# Patient Record
Sex: Male | Born: 1955 | Race: White | Hispanic: No | Smoking: Current every day smoker
Health system: Southern US, Community
[De-identification: ages and names within clinical notes are randomized; demographics above are authoritative.]

## PROBLEM LIST (undated history)

## (undated) DIAGNOSIS — N289 Disorder of kidney and ureter, unspecified: Secondary | ICD-10-CM

## (undated) DIAGNOSIS — Q046 Congenital cerebral cysts: Secondary | ICD-10-CM

## (undated) DIAGNOSIS — B1920 Unspecified viral hepatitis C without hepatic coma: Secondary | ICD-10-CM

## (undated) DIAGNOSIS — I2510 Atherosclerotic heart disease of native coronary artery without angina pectoris: Secondary | ICD-10-CM

## (undated) DIAGNOSIS — J449 Chronic obstructive pulmonary disease, unspecified: Secondary | ICD-10-CM

## (undated) DIAGNOSIS — I10 Essential (primary) hypertension: Secondary | ICD-10-CM

## (undated) HISTORY — PX: ELBOW SURGERY: SHX618

## (undated) HISTORY — PX: CORONARY STENT PLACEMENT: SHX1402

## (undated) HISTORY — PX: TRANSURETHRAL RESECTION OF PROSTATE: SHX73

---

## 2010-10-31 DIAGNOSIS — I2510 Atherosclerotic heart disease of native coronary artery without angina pectoris: Secondary | ICD-10-CM | POA: Insufficient documentation

## 2010-10-31 DIAGNOSIS — Z720 Tobacco use: Secondary | ICD-10-CM | POA: Insufficient documentation

## 2010-10-31 DIAGNOSIS — R569 Unspecified convulsions: Secondary | ICD-10-CM | POA: Insufficient documentation

## 2010-10-31 DIAGNOSIS — I10 Essential (primary) hypertension: Secondary | ICD-10-CM | POA: Diagnosis present

## 2010-11-08 DIAGNOSIS — B1920 Unspecified viral hepatitis C without hepatic coma: Secondary | ICD-10-CM | POA: Insufficient documentation

## 2014-09-05 ENCOUNTER — Emergency Department (HOSPITAL_COMMUNITY)
Admission: EM | Admit: 2014-09-05 | Discharge: 2014-09-05 | Disposition: A | Discharge status: Home / Self Care | Payer: Self-pay | Attending: Emergency Medicine | Admitting: Emergency Medicine

## 2014-09-05 ENCOUNTER — Emergency Department (HOSPITAL_COMMUNITY): Payer: Self-pay

## 2014-09-05 ENCOUNTER — Encounter (HOSPITAL_COMMUNITY): Payer: Self-pay | Admitting: *Deleted

## 2014-09-05 DIAGNOSIS — Z880 Allergy status to penicillin: Secondary | ICD-10-CM | POA: Insufficient documentation

## 2014-09-05 DIAGNOSIS — S4992XA Unspecified injury of left shoulder and upper arm, initial encounter: Secondary | ICD-10-CM | POA: Insufficient documentation

## 2014-09-05 DIAGNOSIS — Y9389 Activity, other specified: Secondary | ICD-10-CM | POA: Insufficient documentation

## 2014-09-05 DIAGNOSIS — W1839XA Other fall on same level, initial encounter: Secondary | ICD-10-CM | POA: Insufficient documentation

## 2014-09-05 DIAGNOSIS — Z87448 Personal history of other diseases of urinary system: Secondary | ICD-10-CM | POA: Insufficient documentation

## 2014-09-05 DIAGNOSIS — Y998 Other external cause status: Secondary | ICD-10-CM | POA: Insufficient documentation

## 2014-09-05 DIAGNOSIS — J441 Chronic obstructive pulmonary disease with (acute) exacerbation: Secondary | ICD-10-CM | POA: Insufficient documentation

## 2014-09-05 DIAGNOSIS — Z720 Tobacco use: Secondary | ICD-10-CM | POA: Insufficient documentation

## 2014-09-05 DIAGNOSIS — Z8619 Personal history of other infectious and parasitic diseases: Secondary | ICD-10-CM | POA: Insufficient documentation

## 2014-09-05 DIAGNOSIS — Y9289 Other specified places as the place of occurrence of the external cause: Secondary | ICD-10-CM | POA: Insufficient documentation

## 2014-09-05 HISTORY — DX: Chronic obstructive pulmonary disease, unspecified: J44.9

## 2014-09-05 HISTORY — DX: Unspecified viral hepatitis C without hepatic coma: B19.20

## 2014-09-05 HISTORY — DX: Disorder of kidney and ureter, unspecified: N28.9

## 2014-09-05 MED ORDER — OXYCODONE-ACETAMINOPHEN 5-325 MG PO TABS
1.0000 | ORAL_TABLET | Freq: Once | ORAL | Status: AC
Start: 2014-09-05 — End: 2014-09-05
  Administered 2014-09-05: 1 via ORAL
  Filled 2014-09-05: qty 1

## 2014-09-05 MED ORDER — NAPROXEN 500 MG PO TABS: 500.0000 mg | ORAL_TABLET | Freq: Two times a day (BID) | ORAL | Status: DC

## 2014-09-05 MED ORDER — HYDROCODONE-ACETAMINOPHEN 7.5-325 MG PO TABS: 1.0000 | ORAL_TABLET | Freq: Four times a day (QID) | ORAL | Status: DC | PRN

## 2014-09-05 NOTE — ED Notes (Signed)
Patient reports falling last week and now reports left shoulder pain. Worse last couple of days.

## 2014-09-05 NOTE — ED Provider Notes (Signed)
CSN: [id number]     Arrival date & time 09/05/14  1101 History  This chart was scribed for non-physician practitioner, Pauline Aus, PA-C, working with Mancel Bale, MD by Marica Otter, ED Scribe. This patient was seen in room APFT20/APFT20 and the patient's care was started at 12:39 PM.   Chief Complaint  Patient presents with  . Shoulder Pain   The history is provided by the patient. No language interpreter was used.   PCP: No primary care provider on file. HPI Comments: Casey Walker is a 59 y.o. male, who presents to the Emergency Department complaining of a fall sustained last week and associated worsening, 8/10, intermittent, left sided, shoulder pain radiating to the left upper arm onset 3-4 days ago. Pt notes the pain is worse at night. Pt denies head trauma, neck pain or LOC resulting from the fall. Pt reports taking Advil at home without improvement. Pt denies, rib pain, back pain, chest wall pain, increased SOB (pt notes he has intermittent SOB at baseline due to COPD), chest pain, neck pain, or any other Sx at this time.   Pt notes he is followed closely by a cardiologist for his chronic heart conditions. Pt notes he had an EKG completed last month which showed no worsening Sx.   Past Medical History  Diagnosis Date  . COPD (chronic obstructive pulmonary disease)   . Hepatitis C   . Kidney disorder     due to hepatitis per patient   Past Surgical History  Procedure Laterality Date  . Elbow surgery    . Coronary stent placement     History reviewed. No pertinent family history. Social History  Substance Use Topics  . Smoking status: Current Every Day Smoker -- 0.25 packs/day  . Smokeless tobacco: None  . Alcohol Use: No    Review of Systems  Constitutional: Negative for fever and chills.  Musculoskeletal: Positive for arthralgias (left sided shoulder pain).  Psychiatric/Behavioral: Negative for confusion.  All other systems reviewed and are negative.  Allergies   Penicillins  Home Medications   Prior to Admission medications   Not on File   Triage Vitals: BP 138/84 mmHg  Pulse 58  Temp(Src) 98.5 F (36.9 C) (Oral)  Resp 18  Ht 5\' 8"  (1.727 m)  Wt 170 lb (77.111 kg)  BMI 25.85 kg/m2  SpO2 100% Physical Exam  Constitutional: He is oriented to person, place, and time. He appears well-developed and well-nourished.  HENT:  Head: Normocephalic.  Eyes: EOM are normal.  Neck: Normal range of motion.  Cardiovascular: Normal rate, regular rhythm and normal heart sounds.   Pulmonary/Chest: Effort normal and breath sounds normal.  Abdominal: He exhibits no distension.  Musculoskeletal: Normal range of motion.  Diffused tenderness of the left shoulder including the AC joint. Pain reproduced with abduction. Grip strength, distal sensation, pulses intact.   Neurological: He is alert and oriented to person, place, and time.  Psychiatric: He has a normal mood and affect.  Nursing note and vitals reviewed.  ED Course  Procedures (including critical care time) DIAGNOSTIC STUDIES: Oxygen Saturation is 100% on RA, nl by my interpretation.    COORDINATION OF CARE: 12:44 PM: Discussed treatment plan which includes discussing imaging results, icing the affected area, ortho referral with pt at bedside; patient verbalizes understanding and agrees with treatment plan.  Imaging Review Dg Shoulder Left  09/05/2014   CLINICAL DATA:  Fall 3 days ago.  Left shoulder pain and swelling.  EXAM: LEFT SHOULDER - 2+ VIEW  COMPARISON:  None.  FINDINGS: No acute bony abnormality. Specifically, no fracture, subluxation, or dislocation. Soft tissues are intact. Mild degenerative changes in the left AC joint.  IMPRESSION: No acute bony abnormality.   Electronically Signed   By: Charlett Nose M.D.   On: 09/05/2014 11:49     MDM   Final diagnoses:  Shoulder injury, left, initial encounter    XR neg for fx, pain likely related to deg changes.  No concerning sx's for  septic joint or cardiac process  Pt agrees to symptomatic tx and close PMD or orthopedic f/u.  Pt is well appearing and stable for d/c   I personally performed the services described in this documentation, which was scribed in my presence. The recorded information has been reviewed and is accurate.    Pauline Aus, PA-C 09/06/14 2305  Mancel Bale, MD 09/07/14 1420

## 2015-01-22 ENCOUNTER — Encounter (HOSPITAL_COMMUNITY): Payer: Self-pay | Admitting: Emergency Medicine

## 2015-01-22 ENCOUNTER — Emergency Department (HOSPITAL_COMMUNITY): Payer: Medicaid - Out of State

## 2015-01-22 ENCOUNTER — Emergency Department (HOSPITAL_COMMUNITY)
Admission: EM | Admit: 2015-01-22 | Discharge: 2015-01-22 | Disposition: A | Discharge status: Home / Self Care | Payer: Medicaid - Out of State | Attending: Emergency Medicine | Admitting: Emergency Medicine

## 2015-01-22 DIAGNOSIS — R0602 Shortness of breath: Secondary | ICD-10-CM | POA: Diagnosis not present

## 2015-01-22 DIAGNOSIS — J441 Chronic obstructive pulmonary disease with (acute) exacerbation: Secondary | ICD-10-CM | POA: Diagnosis not present

## 2015-01-22 DIAGNOSIS — S43402A Unspecified sprain of left shoulder joint, initial encounter: Secondary | ICD-10-CM | POA: Diagnosis not present

## 2015-01-22 DIAGNOSIS — S4992XA Unspecified injury of left shoulder and upper arm, initial encounter: Secondary | ICD-10-CM | POA: Diagnosis present

## 2015-01-22 DIAGNOSIS — Z791 Long term (current) use of non-steroidal anti-inflammatories (NSAID): Secondary | ICD-10-CM | POA: Diagnosis not present

## 2015-01-22 DIAGNOSIS — Z9861 Coronary angioplasty status: Secondary | ICD-10-CM | POA: Insufficient documentation

## 2015-01-22 DIAGNOSIS — W108XXA Fall (on) (from) other stairs and steps, initial encounter: Secondary | ICD-10-CM | POA: Diagnosis not present

## 2015-01-22 DIAGNOSIS — R05 Cough: Secondary | ICD-10-CM | POA: Insufficient documentation

## 2015-01-22 DIAGNOSIS — Z87448 Personal history of other diseases of urinary system: Secondary | ICD-10-CM | POA: Diagnosis not present

## 2015-01-22 DIAGNOSIS — S2231XA Fracture of one rib, right side, initial encounter for closed fracture: Secondary | ICD-10-CM | POA: Diagnosis not present

## 2015-01-22 DIAGNOSIS — Z8619 Personal history of other infectious and parasitic diseases: Secondary | ICD-10-CM | POA: Insufficient documentation

## 2015-01-22 DIAGNOSIS — Y998 Other external cause status: Secondary | ICD-10-CM | POA: Insufficient documentation

## 2015-01-22 DIAGNOSIS — Y9389 Activity, other specified: Secondary | ICD-10-CM | POA: Diagnosis not present

## 2015-01-22 DIAGNOSIS — Z880 Allergy status to penicillin: Secondary | ICD-10-CM | POA: Diagnosis not present

## 2015-01-22 DIAGNOSIS — S299XXA Unspecified injury of thorax, initial encounter: Secondary | ICD-10-CM | POA: Diagnosis not present

## 2015-01-22 DIAGNOSIS — Y9289 Other specified places as the place of occurrence of the external cause: Secondary | ICD-10-CM | POA: Insufficient documentation

## 2015-01-22 DIAGNOSIS — F17200 Nicotine dependence, unspecified, uncomplicated: Secondary | ICD-10-CM | POA: Insufficient documentation

## 2015-01-22 LAB — URINALYSIS, ROUTINE W REFLEX MICROSCOPIC
Bilirubin Urine: NEGATIVE
Glucose, UA: NEGATIVE mg/dL
Hgb urine dipstick: NEGATIVE
KETONES UR: NEGATIVE mg/dL
LEUKOCYTES UA: NEGATIVE
Nitrite: NEGATIVE
PH: 5.5 (ref 5.0–8.0)
Protein, ur: NEGATIVE mg/dL
Specific Gravity, Urine: 1.02 (ref 1.005–1.030)

## 2015-01-22 MED ORDER — DIAZEPAM 5 MG PO TABS
10.0000 mg | ORAL_TABLET | Freq: Once | ORAL | Status: AC
  Administered 2015-01-22: 10 mg via ORAL
  Filled 2015-01-22: qty 2

## 2015-01-22 MED ORDER — CYCLOBENZAPRINE HCL 10 MG PO TABS: 10.0000 mg | ORAL_TABLET | Freq: Three times a day (TID) | ORAL | Status: DC

## 2015-01-22 MED ORDER — KETOROLAC TROMETHAMINE 10 MG PO TABS
10.0000 mg | ORAL_TABLET | Freq: Once | ORAL | Status: AC
  Administered 2015-01-22: 10 mg via ORAL
  Filled 2015-01-22: qty 1

## 2015-01-22 MED ORDER — HYDROCODONE-ACETAMINOPHEN 5-325 MG PO TABS: 1.0000 | ORAL_TABLET | ORAL | Status: DC | PRN

## 2015-01-22 MED ORDER — HYDROCODONE-ACETAMINOPHEN 5-325 MG PO TABS
2.0000 | ORAL_TABLET | Freq: Once | ORAL | Status: AC
  Administered 2015-01-22: 2 via ORAL
  Filled 2015-01-22: qty 2

## 2015-01-22 MED ORDER — MELOXICAM 15 MG PO TABS: 15.0000 mg | ORAL_TABLET | Freq: Every day | ORAL | Status: DC

## 2015-01-22 NOTE — Discharge Instructions (Signed)
You have a broken lower right rib. Please usually incentive spirometer every 2 hours for the next 10-14 days. Please use Flexeril 3 times daily, and Mobic daily with food. Use Norco every 4 hours if needed for pain. Please follow-up with your Medicaid access physician at the end of the week for evaluation and further management of this rib fracture. The x-ray of your shoulder is negative for fracture or dislocation. Please practice good range of motion exercises with your shoulder. Rib Fracture A rib fracture is a break or crack in one of the bones of the ribs. The ribs are a group of long, curved bones that wrap around your chest and attach to your spine. They protect your lungs and other organs in the chest cavity. A broken or cracked rib is often painful, but most do not cause other problems. Most rib fractures heal on their own over time. However, rib fractures can be more serious if multiple ribs are broken or if broken ribs move out of place and push against other structures. CAUSES   A direct blow to the chest. For example, this could happen during contact sports, a car accident, or a fall against a hard object.  Repetitive movements with high force, such as pitching a baseball or having severe coughing spells. SYMPTOMS   Pain when you breathe in or cough.  Pain when someone presses on the injured area. DIAGNOSIS  Your caregiver will perform a physical exam. Various imaging tests may be ordered to confirm the diagnosis and to look for related injuries. These tests may include a chest X-ray, computed tomography (CT), magnetic resonance imaging (MRI), or a bone scan. TREATMENT  Rib fractures usually heal on their own in 1-3 months. The longer healing period is often associated with a continued cough or other aggravating activities. During the healing period, pain control is very important. Medication is usually given to control pain. Hospitalization or surgery may be needed for more severe  injuries, such as those in which multiple ribs are broken or the ribs have moved out of place.  HOME CARE INSTRUCTIONS   Avoid strenuous activity and any activities or movements that cause pain. Be careful during activities and avoid bumping the injured rib.  Gradually increase activity as directed by your caregiver.  Only take over-the-counter or prescription medications as directed by your caregiver. Do not take other medications without asking your caregiver first.  Apply ice to the injured area for the first 1-2 days after you have been treated or as directed by your caregiver. Applying ice helps to reduce inflammation and pain.  Put ice in a plastic bag.  Place a towel between your skin and the bag.   Leave the ice on for 15-20 minutes at a time, every 2 hours while you are awake.  Perform deep breathing as directed by your caregiver. This will help prevent pneumonia, which is a common complication of a broken rib. Your caregiver may instruct you to:  Take deep breaths several times a day.  Try to cough several times a day, holding a pillow against the injured area.  Use a device called an incentive spirometer to practice deep breathing several times a day.  Drink enough fluids to keep your urine clear or pale yellow. This will help you avoid constipation.   Do not wear a rib belt or binder. These restrict breathing, which can lead to pneumonia.  SEEK IMMEDIATE MEDICAL CARE IF:   You have a fever.   You have  difficulty breathing or shortness of breath.   You develop a continual cough, or you cough up thick or bloody sputum.  You feel sick to your stomach (nausea), throw up (vomit), or have abdominal pain.   You have worsening pain not controlled with medications.  MAKE SURE YOU:  Understand these instructions.  Will watch your condition.  Will get help right away if you are not doing well or get worse.   This information is not intended to replace advice  given to you by your health care provider. Make sure you discuss any questions you have with your health care provider.   Document Released: 12/31/2004 Document Revised: 09/02/2012 Document Reviewed: 03/04/2012 Elsevier Interactive Patient Education [employer]2016 [employer]

## 2015-01-22 NOTE — ED Notes (Signed)
Discharge instructions given to pt , verbal;ized understanding, Pt instructed on deep breathing and coughing by this nurse and by Resp therapist ( also educated on in-sentive spirometer)

## 2015-01-22 NOTE — ED Notes (Signed)
Pt states that he fell down some steps on Friday -- Slipped. Co lower Rt back and Lt shoulder pain.  HAs bruising to Rt side/flank area /. No problems with voiding

## 2015-01-22 NOTE — ED Provider Notes (Signed)
CSN: [id number]     Arrival date & time 01/22/15  1117 History   First MD Initiated Contact with Patient 01/22/15 1134     Chief Complaint  Patient presents with  . Shoulder Pain  . Back Pain     (Consider location/radiation/quality/duration/timing/severity/associated sxs/prior Treatment) HPI Comments: Pt states he fell down 2 steps on Friday 1/6. He injured the right lower back and flank. He also c/o injury to the left shoulder. Denies coughing blood. No blood in urine.  Pt has hx of COPD, but no new symptoms of shortness of breath. No new falls since 01/20/15. No loss of control of bowels or bladder. Pt on   [place] due to stent in coronary artery.  Patient is a 60 y.o. male presenting with shoulder pain and back pain. The history is provided by the patient.  Shoulder Pain Associated symptoms: back pain   Associated symptoms: no fever   Back Pain Associated symptoms: no fever     Past Medical History  Diagnosis Date  . COPD (chronic obstructive pulmonary disease) (HCC)   . Hepatitis C   . Kidney disorder     due to hepatitis per patient   Past Surgical History  Procedure Laterality Date  . Elbow surgery    . Coronary stent placement     History reviewed. No pertinent family history. Social History  Substance Use Topics  . Smoking status: Current Every Day Smoker -- 0.25 packs/day  . Smokeless tobacco: None  . Alcohol Use: No    Review of Systems  Constitutional: Negative for fever, activity change and appetite change.  Respiratory: Positive for cough and shortness of breath.   Musculoskeletal: Positive for back pain.       Shoulder pain      Allergies  Penicillins  Home Medications   Prior to Admission medications   Medication Sig Start Date End Date Taking? Authorizing Provider  HYDROcodone-acetaminophen (NORCO) 7.5-325 MG per tablet Take 1 tablet by mouth every 6 (six) hours as needed for moderate pain. 09/05/14   Tammy Triplett, PA-C  naproxen (NAPROSYN) 500  MG tablet Take 1 tablet (500 mg total) by mouth 2 (two) times daily with a meal. 09/05/14   Tammy Triplett, PA-C   BP 148/88 mmHg  Pulse 84  Resp 18  Ht 5\' 8"  (1.727 m)  Wt 77.111 kg  BMI 25.85 kg/m2  SpO2 98% Physical Exam  Constitutional: He is oriented to person, place, and time. He appears well-developed and well-nourished.  Non-toxic appearance.  HENT:  Head: Normocephalic.  Right Ear: Tympanic membrane and external ear normal.  Left Ear: Tympanic membrane and external ear normal.  Eyes: EOM and lids are normal. Pupils are equal, round, and reactive to light.  Neck: Normal range of motion. Neck supple. Carotid bruit is not present.  Cardiovascular: Normal rate, regular rhythm, normal heart sounds, intact distal pulses and normal pulses.   Pulmonary/Chest: Breath sounds normal. No respiratory distress.  Abdominal: Soft. Bowel sounds are normal. There is no tenderness. There is no guarding.  Musculoskeletal: Normal range of motion.  Lymphadenopathy:       Head (right side): No submandibular adenopathy present.       Head (left side): No submandibular adenopathy present.    He has no cervical adenopathy.  Neurological: He is alert and oriented to person, place, and time. He has normal strength. No cranial nerve deficit or sensory deficit.  Skin: Skin is warm and dry.  Psychiatric: He has a normal mood and  affect. His speech is normal.  Nursing note and vitals reviewed.   ED Course  Procedures (including critical care time) Labs Review Labs Reviewed  URINALYSIS, ROUTINE W REFLEX MICROSCOPIC (NOT AT [facility])    Imaging Review Dg Shoulder Left  01/22/2015  CLINICAL DATA:  Fall.  Left shoulder pain. EXAM: LEFT SHOULDER - 2+ VIEW COMPARISON:  09/05/2014 FINDINGS: There is no evidence of fracture or dislocation. There is no evidence of arthropathy or other focal bone abnormality. Soft tissues are unremarkable. IMPRESSION: Negative. Electronically Signed   By: Signa Kell M.D.   On:  01/22/2015 12:10   I have personally reviewed and evaluated these images and lab results as part of my medical decision-making.   EKG Interpretation None      MDM   Xray of the left shoulder is negative for fracture. Xray of the right rib reveals a nondisplaced fracture of the posterior right 11th rib. Pt treated with incentive spirometry  And pain meds. Pt to follow up with pcp or return to ED if any changes or problem.    Final diagnoses:  None    **I have reviewed nursing notes, vital signs, and all appropriate lab and imaging results for this patient.Ivery Quale, PA-C 01/24/15 1309  Leta Baptist, MD 01/29/15 [phone]

## 2015-01-31 ENCOUNTER — Encounter (HOSPITAL_COMMUNITY): Payer: Self-pay | Admitting: Cardiology

## 2015-01-31 ENCOUNTER — Emergency Department (HOSPITAL_COMMUNITY)
Admission: EM | Admit: 2015-01-31 | Discharge: 2015-01-31 | Disposition: A | Discharge status: Home / Self Care | Payer: Medicaid - Out of State | Attending: Emergency Medicine | Admitting: Emergency Medicine

## 2015-01-31 ENCOUNTER — Emergency Department (HOSPITAL_COMMUNITY): Payer: Medicaid - Out of State

## 2015-01-31 DIAGNOSIS — W1839XD Other fall on same level, subsequent encounter: Secondary | ICD-10-CM | POA: Diagnosis not present

## 2015-01-31 DIAGNOSIS — Z955 Presence of coronary angioplasty implant and graft: Secondary | ICD-10-CM | POA: Insufficient documentation

## 2015-01-31 DIAGNOSIS — F17200 Nicotine dependence, unspecified, uncomplicated: Secondary | ICD-10-CM | POA: Diagnosis not present

## 2015-01-31 DIAGNOSIS — S2231XD Fracture of one rib, right side, subsequent encounter for fracture with routine healing: Secondary | ICD-10-CM | POA: Diagnosis not present

## 2015-01-31 DIAGNOSIS — Z87448 Personal history of other diseases of urinary system: Secondary | ICD-10-CM | POA: Diagnosis not present

## 2015-01-31 DIAGNOSIS — I10 Essential (primary) hypertension: Secondary | ICD-10-CM | POA: Diagnosis not present

## 2015-01-31 DIAGNOSIS — Z760 Encounter for issue of repeat prescription: Secondary | ICD-10-CM | POA: Diagnosis present

## 2015-01-31 DIAGNOSIS — Z79899 Other long term (current) drug therapy: Secondary | ICD-10-CM | POA: Insufficient documentation

## 2015-01-31 DIAGNOSIS — Z791 Long term (current) use of non-steroidal anti-inflammatories (NSAID): Secondary | ICD-10-CM | POA: Diagnosis not present

## 2015-01-31 DIAGNOSIS — J449 Chronic obstructive pulmonary disease, unspecified: Secondary | ICD-10-CM | POA: Insufficient documentation

## 2015-01-31 DIAGNOSIS — M20011 Mallet finger of right finger(s): Secondary | ICD-10-CM | POA: Diagnosis not present

## 2015-01-31 DIAGNOSIS — Z8619 Personal history of other infectious and parasitic diseases: Secondary | ICD-10-CM | POA: Diagnosis not present

## 2015-01-31 DIAGNOSIS — Z880 Allergy status to penicillin: Secondary | ICD-10-CM | POA: Insufficient documentation

## 2015-01-31 MED ORDER — HYDROCODONE-ACETAMINOPHEN 5-325 MG PO TABS: 1.0000 | ORAL_TABLET | ORAL | Status: DC | PRN

## 2015-01-31 MED ORDER — OXYCODONE-ACETAMINOPHEN 5-325 MG PO TABS
1.0000 | ORAL_TABLET | Freq: Once | ORAL | Status: AC
  Administered 2015-01-31: 1 via ORAL
  Filled 2015-01-31: qty 1

## 2015-01-31 NOTE — ED Provider Notes (Signed)
CSN: [id number]     Arrival date & time 01/31/15  1451 History   First MD Initiated Contact with Patient 01/31/15 1610     Chief Complaint  Patient presents with  . Medication Refill     (Consider location/radiation/quality/duration/timing/severity/associated sxs/prior Treatment) The history is provided by the patient.   60 year old male fell last week and was diagnosed with right rib fracture. He was discharged with prescription for hydrocodone-acetaminophen and has run out. He states that he still is in a lot of pain. At his home, he does have to go up about 15 steps and that is quite painful. He is unable to get an appointment with his PCP until early March. Also, he has noted that there is a deformity of his right fifth finger and some redness of the right fourth finger which were not addressed at his original ED visit.  Past Medical History  Diagnosis Date  . COPD (chronic obstructive pulmonary disease) (HCC)   . Hepatitis C   . Kidney disorder     due to hepatitis per patient   Past Surgical History  Procedure Laterality Date  . Elbow surgery    . Coronary stent placement     History reviewed. No pertinent family history. Social History  Substance Use Topics  . Smoking status: Current Every Day Smoker -- 0.25 packs/day  . Smokeless tobacco: None  . Alcohol Use: No    Review of Systems  All other systems reviewed and are negative.     Allergies  Penicillins  Home Medications   Prior to Admission medications   Medication Sig Start Date End Date Taking? Authorizing Provider  albuterol (PROAIR HFA) 108 (90 Base) MCG/ACT inhaler Inhale 2 puffs into the lungs every 6 (six) hours as needed. Shortness of breath and wheezing 06/21/12   Historical Provider, MD  amLODipine (NORVASC) 5 MG tablet Take 5 mg by mouth daily.    Historical Provider, MD  ANORO ELLIPTA 62.5-25 MCG/INH AEPB Inhale 1 puff into the lungs daily. 12/16/14   Historical Provider, MD  atenolol (TENORMIN) 50  MG tablet Take 50 mg by mouth daily. 05/01/12   Historical Provider, MD  BRILINTA 90 MG TABS tablet Take 90 mg by mouth 2 (two) times daily. 01/11/15   Historical Provider, MD  clonazePAM (KLONOPIN) 0.5 MG tablet Take 0.5 mg by mouth 3 (three) times daily. 01/09/15   Historical Provider, MD  cyclobenzaprine (FLEXERIL) 10 MG tablet Take 1 tablet (10 mg total) by mouth 3 (three) times daily. 01/22/15   Ivery Quale, PA-C  finasteride (PROSCAR) 5 MG tablet Take 5 mg by mouth daily. 12/26/14   Historical Provider, MD  folic acid (FOLVITE) 1 MG tablet Take 1 mg by mouth daily. 12/26/14   Historical Provider, MD  gabapentin (NEURONTIN) 300 MG capsule Take 300 mg by mouth 2 (two) times daily.    Historical Provider, MD  hydrochlorothiazide (HYDRODIURIL) 25 MG tablet Take 25 mg by mouth daily. 05/01/12   Historical Provider, MD  HYDROcodone-acetaminophen (NORCO/VICODIN) 5-325 MG tablet Take 1-2 tablets by mouth every 4 (four) hours as needed. 01/22/15   Ivery Quale, PA-C  hydrOXYzine (ATARAX/VISTARIL) 25 MG tablet Take 50 mg by mouth every 6 (six) hours as needed. Panic attack 01/03/15   Historical Provider, MD  lisinopril (PRINIVIL,ZESTRIL) 20 MG tablet Take 20 mg by mouth daily. 05/01/12   Historical Provider, MD  LYRICA 100 MG capsule Take 100-300 mg by mouth 2 (two) times daily. 1 capsule in the morning and 3 capsules  at bedtime 01/09/15   Historical Provider, MD  meloxicam (MOBIC) 15 MG tablet Take 1 tablet (15 mg total) by mouth daily. 01/22/15   Ivery Quale, PA-C  mirtazapine (REMERON) 30 MG tablet Take 30 mg by mouth at bedtime. 12/26/14   Historical Provider, MD  naproxen (NAPROSYN) 500 MG tablet Take 1 tablet (500 mg total) by mouth 2 (two) times daily with a meal. Patient not taking: Reported on 01/22/2015 09/05/14   Tammy Triplett, PA-C  OLANZapine (ZYPREXA) 2.5 MG tablet Take 2.5 mg by mouth daily. 12/29/14   Historical Provider, MD  OLANZapine (ZYPREXA) 7.5 MG tablet Take 7.5 mg by mouth at bedtime.  12/29/14   Historical Provider, MD  Omeprazole 20 MG TBEC Take 20 mg by mouth daily. 12/23/14   Historical Provider, MD  polyethylene glycol (MIRALAX / GLYCOLAX) packet Take 1 packet by mouth 2 (two) times daily as needed. constipation 11/17/14   Historical Provider, MD  pravastatin (PRAVACHOL) 40 MG tablet Take 40 mg by mouth at bedtime. 12/26/14   Historical Provider, MD  risperiDONE (RISPERDAL) 2 MG tablet Take 2 mg by mouth at bedtime. 05/01/12   Historical Provider, MD  sertraline (ZOLOFT) 100 MG tablet Take 100 mg by mouth daily. 12/29/14   Historical Provider, MD  SPIRIVA HANDIHALER 18 MCG inhalation capsule Place 1 puff into inhaler and inhale daily. 11/20/14   Historical Provider, MD  tamsulosin (FLOMAX) 0.4 MG CAPS capsule Take 0.4 mg by mouth every evening. 12/19/14   Historical Provider, MD  traZODone (DESYREL) 100 MG tablet Take 200 mg by mouth at bedtime. 01/11/15   Historical Provider, MD  vitamin B-12 (CYANOCOBALAMIN) 100 MCG tablet Take 100 mcg by mouth daily.    Historical Provider, MD   BP 147/87 mmHg  Pulse 73  Temp(Src) 97.1 F (36.2 C) (Tympanic)  Resp 16  Ht 5' 8.5" (1.74 m)  Wt 170 lb (77.111 kg)  BMI 25.47 kg/m2  SpO2 98% Physical Exam  Nursing note and vitals reviewed.  60 year old male, resting comfortably and in no acute distress. Vital signs are significant for hypertension. Oxygen saturation is 98%, which is normal. Head is normocephalic and atraumatic. PERRLA, EOMI. Oropharynx is clear. Neck is nontender and supple without adenopathy or JVD. Back is nontender and there is no CVA tenderness. Lungs are clear without rales, wheezes, or rhonchi. Chest is tender in the right posterior lateral rib cage. Heart has regular rate and rhythm without murmur. Abdomen is soft, flat, nontender without masses or hepatosplenomegaly and peristalsis is normoactive. Extremities have no cyanosis or edema, full range of motion is present. Mallet finger is noted right fifth finger.  Mild redness is noted of the right fourth finger without swelling or significant tenderness. Skin is warm and dry without rash. Neurologic: Mental status is normal, cranial nerves are intact, there are no motor or sensory deficits.  ED Course  Procedures (including critical care time)  Imaging Review Dg Hand Complete Right  01/31/2015  CLINICAL DATA:  Recent fall, generalized hand pain EXAM: RIGHT HAND - COMPLETE 3+ VIEW COMPARISON:  None available FINDINGS: Retained metallic foreign bodies from a remote injury overlying the right third MCP joint. Bones are mildly osteopenic. Mild diffuse degenerative changes with joint space loss and bony spurring of the first MCP joint as well as the interphalangeal joints diffusely. No acute osseous finding or fracture. IMPRESSION: Degenerative osteoarthritis of the hand. Remote appearing metallic foreign bodies over the right third MCP joint Osteopenia No definite acute osseous finding by  plain radiography Electronically Signed   By: Judie Petit.  Shick M.D.   On: 01/31/2015 16:32   I have personally reviewed and evaluated these images as part of my medical decision-making.    MDM   Final diagnoses:  Rib fracture, right, with routine healing, subsequent encounter  Mallet deformity of fifth finger, right, acquired    Right rib fracture return visit for medication refill. Probable mallet finger right fifth finger. He is sent for x-rays. Anticipate need for splinting and full extension and referral to hand surgery. Old records are reviewed confirming ED visit on January 10 for fall with rib fracture.  X-rays of the hand are unremarkable. Finger splint is placed keeping the finger in full extension and he is given refill of his hydrocodone-acetaminophen prescription. Referred to hand surgery for follow-up.  Dione Booze, MD 01/31/15 [phone]

## 2015-01-31 NOTE — ED Notes (Signed)
Seen here last week after a fall and was diagnosed with a rib fracture.  States he is out of his pain medication .

## 2015-01-31 NOTE — Discharge Instructions (Signed)
See the hand doctor about your finger injury. Wear the splint at all times until you see him.  Mallet Finger Mallet finger is an injury that occurs from a blow to the tip of your straightened finger or thumb. It is also known as baseball finger. The blow to your fingertip causes it to bend farther than normal, which tears the cord that attaches to the tip of your finger (extensor tendon). Your extensor tendon is what straightens the end of your finger. If this tendon is damaged, you will not be able to straighten your fingertip. Sometimes, a piece of bone may be pulled away with the tendon (avulsion injury), or the tendon may tear completely. In some cases, surgery may be required to repair the damage. CAUSES Mallet finger is caused by a hard, direct hit to the tip of your finger or thumb. This injury often happens from getting hit in the finger with a hard ball, such as a baseball. RISK FACTORS This injury is more likely to happen if you play sports that use a hard ball. SYMPTOMS  The main symptom of this injury is not being able to straighten the tip of your finger. You can manually straighten your fingertip with your other hand, but the finger cannot straighten on its own. Other symptoms may include:  Pain.  Swelling.  Bruising.  Blood under the fingernail. DIAGNOSIS  Your health care provider may suspect mallet finger if you are not able to extend your fingertip, especially if you recently injured your hand. Your health care provider will do a physical exam. This may include X-rays to see if a piece of bone has been pulled away or if the finger joint has separated (dislocated). TREATMENT  Mallet finger may be treated with:  Wearing a splint on your fingertip to keep it straight (extended) while the tendon heals.  Surgery to repair the tendon, in severe cases. This may involve:  The use of a pin or screw to keep your finger extended and your tendon attached.  Taking a piece of tendon  from another part of your body (graft) to replace a torn tendon. HOME CARE INSTRUCTIONS   Take medicines only as directed by your health care provider.  Wear the splint as directed by your health care provider. Remove it only as directed by your health care provider.  If you take your splint off to dry it or change it, gently press your finger on a flat surface to keep it straight.  If directed, apply ice to the injured area:   Put ice in a plastic bag.   Place a towel between your skin and the bag.   Leave the ice on for 20 minutes, 2-3 times a day.  Raise the injured area above the level of your heart while you are sitting or lying down. SEEK MEDICAL CARE IF:   You have pain or swelling that is getting worse.   Your finger feels cold.   You cannot extend your finger after treatment. SEEK IMMEDIATE MEDICAL CARE IF:   Even after loosening your splint, your finger is:  Very red and swollen.  White or blue.  Numb or tingling.   This information is not intended to replace advice given to you by your health care provider. Make sure you discuss any questions you have with your health care provider.   Document Released: 12/29/1999 Document Revised: 05/17/2014 Document Reviewed: 11/03/2013 Elsevier Interactive Patient Education [employer]

## 2015-02-03 ENCOUNTER — Encounter (HOSPITAL_COMMUNITY): Payer: Self-pay

## 2015-02-03 ENCOUNTER — Emergency Department (HOSPITAL_COMMUNITY): Payer: Medicaid - Out of State

## 2015-02-03 ENCOUNTER — Inpatient Hospital Stay (HOSPITAL_COMMUNITY)
Admission: EM | Admit: 2015-02-03 | Discharge: 2015-02-05 | DRG: 312 | Disposition: A | Discharge status: Home / Self Care | Payer: Medicaid - Out of State | Attending: Family Medicine | Admitting: Family Medicine

## 2015-02-03 DIAGNOSIS — Z8249 Family history of ischemic heart disease and other diseases of the circulatory system: Secondary | ICD-10-CM | POA: Diagnosis not present

## 2015-02-03 DIAGNOSIS — T464X5A Adverse effect of angiotensin-converting-enzyme inhibitors, initial encounter: Secondary | ICD-10-CM | POA: Diagnosis present

## 2015-02-03 DIAGNOSIS — R55 Syncope and collapse: Secondary | ICD-10-CM

## 2015-02-03 DIAGNOSIS — Z79899 Other long term (current) drug therapy: Secondary | ICD-10-CM | POA: Diagnosis not present

## 2015-02-03 DIAGNOSIS — F329 Major depressive disorder, single episode, unspecified: Secondary | ICD-10-CM | POA: Diagnosis present

## 2015-02-03 DIAGNOSIS — I952 Hypotension due to drugs: Principal | ICD-10-CM | POA: Diagnosis present

## 2015-02-03 DIAGNOSIS — T465X5A Adverse effect of other antihypertensive drugs, initial encounter: Secondary | ICD-10-CM | POA: Diagnosis present

## 2015-02-03 DIAGNOSIS — I10 Essential (primary) hypertension: Secondary | ICD-10-CM | POA: Diagnosis present

## 2015-02-03 DIAGNOSIS — F17200 Nicotine dependence, unspecified, uncomplicated: Secondary | ICD-10-CM | POA: Diagnosis present

## 2015-02-03 DIAGNOSIS — E860 Dehydration: Secondary | ICD-10-CM | POA: Diagnosis present

## 2015-02-03 DIAGNOSIS — Z7982 Long term (current) use of aspirin: Secondary | ICD-10-CM | POA: Diagnosis not present

## 2015-02-03 DIAGNOSIS — J449 Chronic obstructive pulmonary disease, unspecified: Secondary | ICD-10-CM | POA: Diagnosis present

## 2015-02-03 DIAGNOSIS — I959 Hypotension, unspecified: Secondary | ICD-10-CM | POA: Diagnosis present

## 2015-02-03 DIAGNOSIS — Z955 Presence of coronary angioplasty implant and graft: Secondary | ICD-10-CM

## 2015-02-03 DIAGNOSIS — I252 Old myocardial infarction: Secondary | ICD-10-CM | POA: Diagnosis not present

## 2015-02-03 DIAGNOSIS — Q046 Congenital cerebral cysts: Secondary | ICD-10-CM

## 2015-02-03 DIAGNOSIS — N179 Acute kidney failure, unspecified: Secondary | ICD-10-CM | POA: Diagnosis present

## 2015-02-03 DIAGNOSIS — N289 Disorder of kidney and ureter, unspecified: Secondary | ICD-10-CM | POA: Insufficient documentation

## 2015-02-03 DIAGNOSIS — I2510 Atherosclerotic heart disease of native coronary artery without angina pectoris: Secondary | ICD-10-CM | POA: Diagnosis present

## 2015-02-03 HISTORY — DX: Congenital cerebral cysts: Q04.6

## 2015-02-03 LAB — URINALYSIS, ROUTINE W REFLEX MICROSCOPIC
BILIRUBIN URINE: NEGATIVE
GLUCOSE, UA: NEGATIVE mg/dL
Hgb urine dipstick: NEGATIVE
KETONES UR: NEGATIVE mg/dL
Leukocytes, UA: NEGATIVE
NITRITE: NEGATIVE
PH: 5.5 (ref 5.0–8.0)
Protein, ur: NEGATIVE mg/dL
Specific Gravity, Urine: 1.015 (ref 1.005–1.030)

## 2015-02-03 LAB — COMPREHENSIVE METABOLIC PANEL
ALBUMIN: 3.7 g/dL (ref 3.5–5.0)
ALK PHOS: 80 U/L (ref 38–126)
ALT: 23 U/L (ref 17–63)
AST: 31 U/L (ref 15–41)
Anion gap: 8 (ref 5–15)
BILIRUBIN TOTAL: 0.6 mg/dL (ref 0.3–1.2)
BUN: 19 mg/dL (ref 6–20)
CALCIUM: 9.1 mg/dL (ref 8.9–10.3)
CO2: 23 mmol/L (ref 22–32)
CREATININE: 1.97 mg/dL — AB (ref 0.61–1.24)
Chloride: 99 mmol/L — ABNORMAL LOW (ref 101–111)
GFR calc Af Amer: 40 mL/min — ABNORMAL LOW (ref >60)
GFR calc non Af Amer: 35 mL/min — ABNORMAL LOW (ref >60)
GLUCOSE: 78 mg/dL (ref 65–99)
Potassium: 3.3 mmol/L — ABNORMAL LOW (ref 3.5–5.1)
Sodium: 130 mmol/L — ABNORMAL LOW (ref 135–145)
TOTAL PROTEIN: 7 g/dL (ref 6.5–8.1)

## 2015-02-03 LAB — LIPASE, BLOOD: Lipase: 34 U/L (ref 11–51)

## 2015-02-03 LAB — I-STAT CG4 LACTIC ACID, ED
Lactic Acid, Venous: 1.72 mmol/L (ref 0.5–2.0)
Lactic Acid, Venous: 2.03 mmol/L (ref 0.5–2.0)

## 2015-02-03 LAB — CBC WITH DIFFERENTIAL/PLATELET
BASOS PCT: 0 %
Basophils Absolute: 0 10*3/uL (ref 0.0–0.1)
Eosinophils Absolute: 0.1 10*3/uL (ref 0.0–0.7)
Eosinophils Relative: 2 %
HEMATOCRIT: 37.7 % — AB (ref 39.0–52.0)
Hemoglobin: 12.5 g/dL — ABNORMAL LOW (ref 13.0–17.0)
Lymphocytes Relative: 39 %
Lymphs Abs: 1.3 10*3/uL (ref 0.7–4.0)
MCH: 30.3 pg (ref 26.0–34.0)
MCHC: 33.2 g/dL (ref 30.0–36.0)
MCV: 91.5 fL (ref 78.0–100.0)
MONO ABS: 0.3 10*3/uL (ref 0.1–1.0)
MONOS PCT: 10 %
NEUTROS ABS: 1.6 10*3/uL — AB (ref 1.7–7.7)
Neutrophils Relative %: 49 %
Platelets: 127 10*3/uL — ABNORMAL LOW (ref 150–400)
RBC: 4.12 MIL/uL — ABNORMAL LOW (ref 4.22–5.81)
RDW: 15.1 % (ref 11.5–15.5)
WBC: 3.3 10*3/uL — ABNORMAL LOW (ref 4.0–10.5)

## 2015-02-03 LAB — I-STAT TROPONIN, ED: Troponin i, poc: 0 ng/mL (ref 0.00–0.08)

## 2015-02-03 MED ORDER — SODIUM CHLORIDE 0.9 % IV BOLUS (SEPSIS)
1000.0000 mL | Freq: Once | INTRAVENOUS | Status: AC
  Administered 2015-02-03: 1000 mL via INTRAVENOUS

## 2015-02-03 MED ORDER — NICOTINE 21 MG/24HR TD PT24
21.0000 mg | MEDICATED_PATCH | Freq: Every day | TRANSDERMAL | Status: DC
  Administered 2015-02-03 – 2015-02-05 (×3): 21 mg via TRANSDERMAL
  Filled 2015-02-03 (×3): qty 1

## 2015-02-03 MED ORDER — TIOTROPIUM BROMIDE MONOHYDRATE 18 MCG IN CAPS
18.0000 ug | ORAL_CAPSULE | Freq: Every day | RESPIRATORY_TRACT | Status: DC
  Administered 2015-02-04 – 2015-02-05 (×2): 18 ug via RESPIRATORY_TRACT
  Filled 2015-02-03: qty 5

## 2015-02-03 MED ORDER — MIRTAZAPINE 30 MG PO TABS
30.0000 mg | ORAL_TABLET | Freq: Every day | ORAL | Status: DC
  Administered 2015-02-03 – 2015-02-04 (×2): 30 mg via ORAL
  Filled 2015-02-03 (×2): qty 1

## 2015-02-03 MED ORDER — OLANZAPINE 5 MG PO TABS
2.5000 mg | ORAL_TABLET | Freq: Every morning | ORAL | Status: DC
  Administered 2015-02-04 – 2015-02-05 (×2): 2.5 mg via ORAL
  Filled 2015-02-03 (×2): qty 1

## 2015-02-03 MED ORDER — OXYBUTYNIN CHLORIDE ER 5 MG PO TB24
15.0000 mg | ORAL_TABLET | Freq: Every day | ORAL | Status: DC
  Administered 2015-02-03 – 2015-02-04 (×2): 15 mg via ORAL
  Filled 2015-02-03 (×2): qty 3

## 2015-02-03 MED ORDER — SODIUM CHLORIDE 0.9 % IV SOLN
INTRAVENOUS | Status: DC
  Administered 2015-02-03: 16:00:00 via INTRAVENOUS

## 2015-02-03 MED ORDER — PREGABALIN 50 MG PO CAPS
100.0000 mg | ORAL_CAPSULE | Freq: Every day | ORAL | Status: DC
  Administered 2015-02-04 – 2015-02-05 (×2): 100 mg via ORAL
  Filled 2015-02-03 (×2): qty 2

## 2015-02-03 MED ORDER — UMECLIDINIUM-VILANTEROL 62.5-25 MCG/INH IN AEPB
1.0000 | INHALATION_SPRAY | Freq: Every day | RESPIRATORY_TRACT | Status: DC
  Filled 2015-02-03 (×3): qty 1

## 2015-02-03 MED ORDER — HYDROCODONE-ACETAMINOPHEN 5-325 MG PO TABS
1.0000 | ORAL_TABLET | ORAL | Status: DC | PRN
  Administered 2015-02-03: 1 via ORAL
  Administered 2015-02-04 (×3): 2 via ORAL
  Administered 2015-02-04 – 2015-02-05 (×4): 1 via ORAL
  Filled 2015-02-03: qty 1
  Filled 2015-02-03: qty 2
  Filled 2015-02-03: qty 1
  Filled 2015-02-03: qty 2
  Filled 2015-02-03 (×2): qty 1
  Filled 2015-02-03: qty 2
  Filled 2015-02-03: qty 1

## 2015-02-03 MED ORDER — CLONAZEPAM 0.5 MG PO TABS
0.5000 mg | ORAL_TABLET | Freq: Three times a day (TID) | ORAL | Status: DC
  Administered 2015-02-03 – 2015-02-05 (×5): 0.5 mg via ORAL
  Filled 2015-02-03 (×5): qty 1

## 2015-02-03 MED ORDER — RISPERIDONE 1 MG PO TABS
2.0000 mg | ORAL_TABLET | Freq: Every day | ORAL | Status: DC
  Administered 2015-02-03 – 2015-02-04 (×2): 2 mg via ORAL
  Filled 2015-02-03 (×2): qty 2

## 2015-02-03 MED ORDER — TAMSULOSIN HCL 0.4 MG PO CAPS
0.4000 mg | ORAL_CAPSULE | Freq: Every evening | ORAL | Status: DC
  Administered 2015-02-03 – 2015-02-04 (×2): 0.4 mg via ORAL
  Filled 2015-02-03 (×2): qty 1

## 2015-02-03 MED ORDER — PREGABALIN 75 MG PO CAPS
300.0000 mg | ORAL_CAPSULE | Freq: Every day | ORAL | Status: DC
  Administered 2015-02-03 – 2015-02-04 (×2): 300 mg via ORAL
  Filled 2015-02-03 (×2): qty 4

## 2015-02-03 MED ORDER — TICAGRELOR 90 MG PO TABS
ORAL_TABLET | ORAL | Status: AC
  Filled 2015-02-03: qty 1

## 2015-02-03 MED ORDER — SERTRALINE HCL 50 MG PO TABS
100.0000 mg | ORAL_TABLET | Freq: Every day | ORAL | Status: DC
  Administered 2015-02-04 – 2015-02-05 (×2): 100 mg via ORAL
  Filled 2015-02-03 (×2): qty 2

## 2015-02-03 MED ORDER — TICAGRELOR 90 MG PO TABS
90.0000 mg | ORAL_TABLET | Freq: Two times a day (BID) | ORAL | Status: DC
  Administered 2015-02-03 – 2015-02-04 (×3): 90 mg via ORAL
  Filled 2015-02-03 (×6): qty 1

## 2015-02-03 MED ORDER — ATENOLOL 25 MG PO TABS
50.0000 mg | ORAL_TABLET | Freq: Two times a day (BID) | ORAL | Status: DC
  Administered 2015-02-04 – 2015-02-05 (×3): 50 mg via ORAL
  Filled 2015-02-03 (×3): qty 2

## 2015-02-03 MED ORDER — PREGABALIN 50 MG PO CAPS: 100.0000 mg | ORAL_CAPSULE | Freq: Two times a day (BID) | ORAL | Status: DC

## 2015-02-03 MED ORDER — ALUM & MAG HYDROXIDE-SIMETH 200-200-20 MG/5ML PO SUSP: 30.0000 mL | Freq: Four times a day (QID) | ORAL | Status: DC | PRN

## 2015-02-03 MED ORDER — ONDANSETRON HCL 4 MG/2ML IJ SOLN: 4.0000 mg | Freq: Four times a day (QID) | INTRAMUSCULAR | Status: DC | PRN

## 2015-02-03 MED ORDER — OLANZAPINE 5 MG PO TABS
7.5000 mg | ORAL_TABLET | Freq: Every day | ORAL | Status: DC
  Administered 2015-02-03 – 2015-02-04 (×2): 7.5 mg via ORAL
  Filled 2015-02-03 (×2): qty 2

## 2015-02-03 MED ORDER — ACETAMINOPHEN 500 MG PO TABS
1000.0000 mg | ORAL_TABLET | Freq: Once | ORAL | Status: AC
  Administered 2015-02-03: 1000 mg via ORAL
  Filled 2015-02-03: qty 2

## 2015-02-03 MED ORDER — PRAVASTATIN SODIUM 40 MG PO TABS
40.0000 mg | ORAL_TABLET | Freq: Every day | ORAL | Status: DC
  Administered 2015-02-03 – 2015-02-04 (×2): 40 mg via ORAL
  Filled 2015-02-03 (×2): qty 1

## 2015-02-03 MED ORDER — LISINOPRIL 10 MG PO TABS
20.0000 mg | ORAL_TABLET | Freq: Every day | ORAL | Status: DC
Start: 2015-02-04 — End: 2015-02-04
  Administered 2015-02-04: 20 mg via ORAL
  Filled 2015-02-03: qty 2

## 2015-02-03 MED ORDER — ONDANSETRON HCL 4 MG PO TABS: 4.0000 mg | ORAL_TABLET | Freq: Four times a day (QID) | ORAL | Status: DC | PRN

## 2015-02-03 MED ORDER — ENOXAPARIN SODIUM 40 MG/0.4ML ~~LOC~~ SOLN
40.0000 mg | SUBCUTANEOUS | Status: DC
  Administered 2015-02-03 – 2015-02-04 (×2): 40 mg via SUBCUTANEOUS
  Filled 2015-02-03 (×2): qty 0.4

## 2015-02-03 MED ORDER — PANTOPRAZOLE SODIUM 40 MG PO TBEC
40.0000 mg | DELAYED_RELEASE_TABLET | Freq: Every day | ORAL | Status: DC
  Administered 2015-02-04 – 2015-02-05 (×2): 40 mg via ORAL
  Filled 2015-02-03 (×2): qty 1

## 2015-02-03 MED ORDER — SODIUM CHLORIDE 0.9 % IV BOLUS (SEPSIS): 1000.0000 mL | Freq: Once | INTRAVENOUS | Status: DC

## 2015-02-03 MED ORDER — ALBUTEROL SULFATE (2.5 MG/3ML) 0.083% IN NEBU
3.0000 mL | INHALATION_SOLUTION | Freq: Four times a day (QID) | RESPIRATORY_TRACT | Status: DC | PRN
  Administered 2015-02-04: 3 mL via RESPIRATORY_TRACT
  Filled 2015-02-03: qty 3

## 2015-02-03 MED ORDER — ASPIRIN EC 81 MG PO TBEC
81.0000 mg | DELAYED_RELEASE_TABLET | Freq: Every day | ORAL | Status: DC
  Administered 2015-02-04 – 2015-02-05 (×2): 81 mg via ORAL
  Filled 2015-02-03 (×2): qty 1

## 2015-02-03 MED ORDER — FINASTERIDE 5 MG PO TABS
5.0000 mg | ORAL_TABLET | Freq: Every day | ORAL | Status: DC
  Administered 2015-02-04: 5 mg via ORAL
  Filled 2015-02-03 (×3): qty 1

## 2015-02-03 MED ORDER — TRAZODONE HCL 50 MG PO TABS
50.0000 mg | ORAL_TABLET | Freq: Every day | ORAL | Status: DC
  Administered 2015-02-03 – 2015-02-04 (×2): 50 mg via ORAL
  Filled 2015-02-03 (×2): qty 1

## 2015-02-03 MED ORDER — SODIUM CHLORIDE 0.9 % IJ SOLN
3.0000 mL | Freq: Two times a day (BID) | INTRAMUSCULAR | Status: DC
  Administered 2015-02-04: 3 mL via INTRAVENOUS

## 2015-02-03 NOTE — ED Notes (Signed)
MD at bedside. 

## 2015-02-03 NOTE — ED Provider Notes (Addendum)
CSN: [id number]     Arrival date & time 02/03/15  1212 History  By signing my name below, I, Marica Otter, attest that this documentation has been prepared under the direction and in the presence of Vanetta Mulders, MD. Electronically Signed: Marica Otter, ED Scribe. 02/03/2015. 1:14 PM  Chief Complaint  Patient presents with  . Loss of Consciousness   The history is provided by the patient. No language interpreter was used.   PCP: Arlina Robes, MD HPI Comments: Casey Walker is a 60 y.o. male, with PMHx noted below including COPD, daily tobacco use (0.5 ppd), and hepatitis C, who presents to the Emergency Department complaining of multiple episodes of syncope onset this past month. Pt reports he lost consciousness +30 x earlier this month with three syncopal episodes yesterday and two this morning. Pt reports falling after losing consciousness today resulting in head trauma. Associated Sx include headache. Pt notes he has no Sx prior to his episodes of syncope-- pt states "I get no warning". BP is 91/66 and O2 is 98% in RA during exam.   Pt affirms visual changes, congestion, rhinorrhea, cough at baseline, SOB at baseline, chest pain, abd pain, loose bowel movement this morning, lower back pain  Pt denies fever, chills, sore throat, n/v/d, dysuria, hematuria, swelling of legs, lightheadedness, or any new rashes. Pt further denies Hx of bleeding easily/blood thinner use. Pt denies home O2 use.   Past Medical History  Diagnosis Date  . COPD (chronic obstructive pulmonary disease) (HCC)   . Hepatitis C   . Kidney disorder     due to hepatitis per patient   Past Surgical History  Procedure Laterality Date  . Elbow surgery    . Coronary stent placement     No family history on file. Social History  Substance Use Topics  . Smoking status: Current Every Day Smoker -- 0.25 packs/day  . Smokeless tobacco: None  . Alcohol Use: No    Review of Systems  Constitutional: Negative  for fever and chills.  HENT: Positive for congestion and rhinorrhea. Negative for sore throat.   Eyes: Positive for visual disturbance.  Respiratory: Positive for cough and shortness of breath.   Cardiovascular: Positive for chest pain.  Gastrointestinal: Positive for abdominal pain. Negative for nausea, vomiting and diarrhea.  Genitourinary: Negative for dysuria.  Musculoskeletal: Positive for back pain.  Skin: Negative for rash.  Neurological: Positive for headaches.  Hematological: Does not bruise/bleed easily.  Psychiatric/Behavioral: Negative for confusion.      Allergies  Penicillins  Home Medications   Prior to Admission medications   Medication Sig Start Date End Date Taking? Authorizing Provider  albuterol (PROAIR HFA) 108 (90 Base) MCG/ACT inhaler Inhale 2 puffs into the lungs every 6 (six) hours as needed. Shortness of breath and wheezing 06/21/12  Yes Historical Provider, MD  ANORO ELLIPTA 62.5-25 MCG/INH AEPB Inhale 1 puff into the lungs daily. 12/16/14  Yes Historical Provider, MD  aspirin EC 81 MG tablet Take 81 mg by mouth daily.   Yes Historical Provider, MD  atenolol (TENORMIN) 50 MG tablet Take 50 mg by mouth 2 (two) times daily.  05/01/12  Yes Historical Provider, MD  BRILINTA 90 MG TABS tablet Take 90 mg by mouth 2 (two) times daily. 01/11/15  Yes Historical Provider, MD  clonazePAM (KLONOPIN) 0.5 MG tablet Take 0.5 mg by mouth 3 (three) times daily. 01/09/15  Yes Historical Provider, MD  finasteride (PROSCAR) 5 MG tablet Take 5 mg by mouth daily. 12/26/14  Yes Historical Provider, MD  folic acid (FOLVITE) 1 MG tablet Take 1 mg by mouth daily. 12/26/14  Yes Historical Provider, MD  hydrochlorothiazide (HYDRODIURIL) 25 MG tablet Take 25 mg by mouth daily. 05/01/12  Yes Historical Provider, MD  HYDROcodone-acetaminophen (NORCO/VICODIN) 5-325 MG tablet Take 1-2 tablets by mouth every 4 (four) hours as needed. Patient taking differently: Take 1-2 tablets by mouth every 4  (four) hours as needed for moderate pain or severe pain.  01/31/15  Yes Dione Booze, MD  lisinopril (PRINIVIL,ZESTRIL) 20 MG tablet Take 20 mg by mouth daily. 05/01/12  Yes Historical Provider, MD  losartan (COZAAR) 50 MG tablet Take 50 mg by mouth daily.   Yes Historical Provider, MD  LYRICA 100 MG capsule Take 100-300 mg by mouth 2 (two) times daily. 1 capsule in the morning and 3 capsules at bedtime 01/09/15  Yes Historical Provider, MD  mirtazapine (REMERON) 30 MG tablet Take 30 mg by mouth at bedtime. 12/26/14  Yes Historical Provider, MD  nicotine (NICODERM CQ - DOSED IN MG/24 HOURS) 21 mg/24hr patch Place 21 mg onto the skin daily.   Yes Historical Provider, MD  OLANZapine (ZYPREXA) 2.5 MG tablet Take 2.5 mg by mouth every morning.  12/29/14  Yes Historical Provider, MD  OLANZapine (ZYPREXA) 7.5 MG tablet Take 7.5 mg by mouth at bedtime. 12/29/14  Yes Historical Provider, MD  Omeprazole 20 MG TBEC Take 20 mg by mouth daily. 12/23/14  Yes Historical Provider, MD  oxybutynin (DITROPAN XL) 15 MG 24 hr tablet Take 15 mg by mouth at bedtime.   Yes Historical Provider, MD  pravastatin (PRAVACHOL) 40 MG tablet Take 40 mg by mouth at bedtime. 12/26/14  Yes Historical Provider, MD  risperiDONE (RISPERDAL) 2 MG tablet Take 2 mg by mouth at bedtime. 05/01/12  Yes Historical Provider, MD  sertraline (ZOLOFT) 100 MG tablet Take 100 mg by mouth daily. 12/29/14  Yes Historical Provider, MD  SPIRIVA HANDIHALER 18 MCG inhalation capsule Place 1 puff into inhaler and inhale daily. 11/20/14  Yes Historical Provider, MD  tamsulosin (FLOMAX) 0.4 MG CAPS capsule Take 0.4 mg by mouth every evening. 12/19/14  Yes Historical Provider, MD  traZODone (DESYREL) 100 MG tablet Take 50 mg by mouth at bedtime.  01/11/15  Yes Historical Provider, MD  vitamin B-12 (CYANOCOBALAMIN) 100 MCG tablet Take 100 mcg by mouth daily.   Yes Historical Provider, MD  cyclobenzaprine (FLEXERIL) 10 MG tablet Take 1 tablet (10 mg total) by mouth 3  (three) times daily. Patient not taking: Reported on 02/03/2015 01/22/15   Ivery Quale, PA-C  naproxen (NAPROSYN) 500 MG tablet Take 1 tablet (500 mg total) by mouth 2 (two) times daily with a meal. Patient not taking: Reported on 01/22/2015 09/05/14   Pauline Aus, PA-C   Triage Vitals: BP 86/60 mmHg  Pulse 66  Temp(Src) 97.6 F (36.4 C) (Tympanic)  Resp 18  Ht 5' 8.5" (1.74 m)  Wt 170 lb (77.111 kg)  BMI 25.47 kg/m2  SpO2 98% Physical Exam  Constitutional: He is oriented to person, place, and time. He appears well-developed and well-nourished.  HENT:  Occipital Region: 3cm laceration appears a few days old  Eyes: Conjunctivae and EOM are normal. Pupils are equal, round, and reactive to light. No scleral icterus.  Eyes tracking normally   Neck: Normal range of motion.  Cardiovascular: Normal rate, regular rhythm and normal heart sounds.   Pulmonary/Chest: Effort normal. No respiratory distress.  Abdominal: Soft. Bowel sounds are normal. There is no tenderness.  Musculoskeletal: He exhibits no edema.       Hands:      Right foot: There is no swelling.       Left foot: There is no swelling.  Ca refill bilateral hands 1 second.   Neurological: He is alert and oriented to person, place, and time.  Psychiatric: He has a normal mood and affect.    ED Course  Procedures (including critical care time) DIAGNOSTIC STUDIES: Oxygen Saturation is 98% on ra, nl by my interpretation.    COORDINATION OF CARE: 1:10 PM: Discussed treatment plan which includes imaging, labs, EKG with pt at bedside; patient verbalizes understanding and agrees with treatment plan.  Labs Review Labs Reviewed  COMPREHENSIVE METABOLIC PANEL - Abnormal; Notable for the following:    Sodium 130 (*)    Potassium 3.3 (*)    Chloride 99 (*)    Creatinine, Ser 1.97 (*)    GFR calc non Af Amer 35 (*)    GFR calc Af Amer 40 (*)    All other components within normal limits  CBC WITH DIFFERENTIAL/PLATELET - Abnormal;  Notable for the following:    WBC 3.3 (*)    RBC 4.12 (*)    Hemoglobin 12.5 (*)    HCT 37.7 (*)    Platelets 127 (*)    Neutro Abs 1.6 (*)    All other components within normal limits  LIPASE, BLOOD  URINALYSIS, ROUTINE W REFLEX MICROSCOPIC (NOT AT [facility])  I-STAT TROPOININ, ED  I-STAT CG4 LACTIC ACID, ED   Results for orders placed or performed during the hospital encounter of 02/03/15  Comprehensive metabolic panel  Result Value Ref Range   Sodium 130 (L) 135 - 145 mmol/L   Potassium 3.3 (L) 3.5 - 5.1 mmol/L   Chloride 99 (L) 101 - 111 mmol/L   CO2 23 22 - 32 mmol/L   Glucose, Bld 78 65 - 99 mg/dL   BUN 19 6 - 20 mg/dL   Creatinine, Ser 1.61 (H) 0.61 - 1.24 mg/dL   Calcium 9.1 8.9 - 09.6 mg/dL   Total Protein 7.0 6.5 - 8.1 g/dL   Albumin 3.7 3.5 - 5.0 g/dL   AST 31 15 - 41 U/L   ALT 23 17 - 63 U/L   Alkaline Phosphatase 80 38 - 126 U/L   Total Bilirubin 0.6 0.3 - 1.2 mg/dL   GFR calc non Af Amer 35 (L) >60 mL/min   GFR calc Af Amer 40 (L) >60 mL/min   Anion gap 8 5 - 15  Lipase, blood  Result Value Ref Range   Lipase 34 11 - 51 U/L  CBC with Differential/Platelet  Result Value Ref Range   WBC 3.3 (L) 4.0 - 10.5 K/uL   RBC 4.12 (L) 4.22 - 5.81 MIL/uL   Hemoglobin 12.5 (L) 13.0 - 17.0 g/dL   HCT 04.5 (L) 40.9 - 81.1 %   MCV 91.5 78.0 - 100.0 fL   MCH 30.3 26.0 - 34.0 pg   MCHC 33.2 30.0 - 36.0 g/dL   RDW 91.4 78.2 - 95.6 %   Platelets 127 (L) 150 - 400 K/uL   Neutrophils Relative % 49 %   Neutro Abs 1.6 (L) 1.7 - 7.7 K/uL   Lymphocytes Relative 39 %   Lymphs Abs 1.3 0.7 - 4.0 K/uL   Monocytes Relative 10 %   Monocytes Absolute 0.3 0.1 - 1.0 K/uL   Eosinophils Relative 2 %   Eosinophils Absolute 0.1 0.0 - 0.7 K/uL  Basophils Relative 0 %   Basophils Absolute 0.0 0.0 - 0.1 K/uL  I-Stat Troponin, ED (not at [facility])  Result Value Ref Range   Troponin i, poc 0.00 0.00 - 0.08 ng/mL   Comment 3          I-Stat CG4 Lactic Acid, ED  Result Value Ref Range   Lactic  Acid, Venous 1.72 0.5 - 2.0 mmol/L     Imaging Review Dg Chest 2 View  02/03/2015  CLINICAL DATA:  Syncope. EXAM: CHEST  2 VIEW COMPARISON:  January 22, 2015. FINDINGS: The heart size and mediastinal contours are within normal limits. No pneumothorax or pleural effusion is noted. Right lung is clear. Stable left basilar scarring is noted. No acute pulmonary disease is noted. The visualized skeletal structures are unremarkable. IMPRESSION: No active cardiopulmonary disease. Electronically Signed   By: Lupita Raider, M.D.   On: 02/03/2015 14:20   Ct Head Wo Contrast  02/03/2015  CLINICAL DATA:  Multiple syncopal episodes. The patient fell twice today due to syncope and head loss of consciousness for greater than 30 minutes and now complains of visual disturbance and occipital headache. EXAM: CT HEAD WITHOUT CONTRAST CT CERVICAL SPINE WITHOUT CONTRAST TECHNIQUE: Multidetector CT imaging of the head and cervical spine was performed following the standard protocol without intravenous contrast. Multiplanar CT image reconstructions of the cervical spine were also generated. COMPARISON:  None FINDINGS: CT HEAD FINDINGS There is a 4 mm colloid cyst in the anterior aspect of the roof of the third ventricle immediately adjacent to the foramen of Monro. No other mass lesions. No acute intracranial hemorrhage or infarction. Brain parenchyma is otherwise normal. No acute osseous abnormality. Old deformity of the nasal bone. CT CERVICAL SPINE FINDINGS There is no fracture or subluxation or prevertebral soft tissue swelling. There is multilevel degenerative disc disease, most prominent at C4-5 on the left. There is multilevel degenerative disc disease with right foraminal stenosis at C3-4, left foraminal stenosis at C4-5, and right foraminal stenosis at C5-6. IMPRESSION: 1. No acute intracranial abnormality. 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 These lesions can predispose the patient to obstructive hydrocephalus and  sudden death. Neurosurgical consultation suggested. 2. No acute abnormality of the cervical spine. Multilevel degenerative disc and joint disease. Electronically Signed   By: Francene Boyers M.D.   On: 02/03/2015 14:18   Ct Cervical Spine Wo Contrast  02/03/2015  CLINICAL DATA:  Multiple syncopal episodes. The patient fell twice today due to syncope and head loss of consciousness for greater than 30 minutes and now complains of visual disturbance and occipital headache. EXAM: CT HEAD WITHOUT CONTRAST CT CERVICAL SPINE WITHOUT CONTRAST TECHNIQUE: Multidetector CT imaging of the head and cervical spine was performed following the standard protocol without intravenous contrast. Multiplanar CT image reconstructions of the cervical spine were also generated. COMPARISON:  None FINDINGS: CT HEAD FINDINGS There is a 4 mm colloid cyst in the anterior aspect of the roof of the third ventricle immediately adjacent to the foramen of Monro. No other mass lesions. No acute intracranial hemorrhage or infarction. Brain parenchyma is otherwise normal. No acute osseous abnormality. Old deformity of the nasal bone. CT CERVICAL SPINE FINDINGS There is no fracture or subluxation or prevertebral soft tissue swelling. There is multilevel degenerative disc disease, most prominent at C4-5 on the left. There is multilevel degenerative disc disease with right foraminal stenosis at C3-4, left foraminal stenosis at C4-5, and right foraminal stenosis at C5-6. IMPRESSION: 1. No acute intracranial  abnormality. 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 These lesions can predispose the patient to obstructive hydrocephalus and sudden death. Neurosurgical consultation suggested. 2. No acute abnormality of the cervical spine. Multilevel degenerative disc and joint disease. Electronically Signed   By: Francene Boyers M.D.   On: 02/03/2015 14:18   I have personally reviewed and evaluated these images and lab results as part of my medical  decision-making.   EKG Interpretation   Date/Time:  Friday February 03 2015 12:39:22 EST Ventricular Rate:  61 PR Interval:  224 QRS Duration: 108 QT Interval:  416 QTC Calculation: 419 R Axis:   33 Text Interpretation:  Sinus rhythm Prolonged PR interval No previous ECGs  available Confirmed by Jamaiyah Pyle  MD, Keitra Carusone [phone]) on 02/03/2015 1:37:31  PM      MDM   Final diagnoses:  Syncope, unspecified syncope type  Hypotension, unspecified hypotension type    Patient with multiple syncopal episodes also arrived of with low blood pressure. However blood pressure has been above 90 systolic here of late most recent was 119/75. Patient head CT with a colloidal cyst in the third ventricle awaiting discussion with neurosurgery to see if there is anything acutely to be done about this. Patient clearly will require admission for the syncopal episodes. Rest of workup without any acute findings. No significant arrhythmias cervical spine without any acute findings chest x-ray without any acute findings labs without any significant abnormalities. Including a normal lactic acid.  If no direct neurosurgical consultation is required patient will need to be transferred to [facility]. Discussed with neurosurgery Dr. Yetta Barre does not feel that the colloidal cyst is playing any role in the syncopal events. Patient cleared for admission here. No neurosurgical follow-up required.    I personally performed the services described in this documentation, which was scribed in my presence. The recorded information has been reviewed and is accurate.      Vanetta Mulders, MD 02/03/15 1540  Vanetta Mulders, MD 02/03/15 [phone]

## 2015-02-03 NOTE — ED Notes (Signed)
Pt in radiology 

## 2015-02-03 NOTE — ED Notes (Signed)
Pt reports frequent syncopal episodes this past month.  Reports passed out twice this morning.  Reports has hit his head several times.  Pt c/o headache.

## 2015-02-03 NOTE — ED Notes (Signed)
Gave patient meal tray and drink as requested and approved by Dr Deretha Emory.

## 2015-02-03 NOTE — H&P (Signed)
History and Physical  Casey Walker ZOX:[id number] DOB: [birth year] DOA: 02/03/2015  Referring physician: Dr Deretha Emory, ED physician PCP: Arlina Robes, MD   Chief Complaint: Syncopal episodes  HPI: Casey Walker is a 60 y.o. male  With a history of essential hypertension on 4 antihypertensives, COPD, hepatitis C, prior MI in 2011 and coronary artery disease with stenting, and major depression disorder on antidepressants and antipsychotics. Patient reports having multiple syncopal episodes throughout the day starting approximately 3-4 weeks ago.  The syncopal episodes started all of a sudden and have increased in frequency. He has approximately 3-4 episodes today. He reports no prodromal symptoms of chest pain, palpitations, dizziness, lightheadedness, blurred vision, presyncope, or nausea. These episodes have been witnessed and he reports no tremors or convulsive type activity. He does have some mild confusion that rapidly improves after the episodes. The episodes occur throughout the day with no pattern. He reports no changes in medications: No new medications or dose changes.    Review of Systems:   Pt complains of  Orthostasis  Pt denies any fevers, chills, nausea, vomiting, diarrhea, constipation, abdominal pain, shortness of breath, dyspnea on exertion, orthopnea, cough, wheezing, palpitations, headache, vision changes, lightheadedness, dizziness, diarrhea, constipation, melena, rectal bleeding.  Review of systems are otherwise negative  Past Medical History  Diagnosis Date  . COPD (chronic obstructive pulmonary disease) (HCC)   . 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 Hepatitis C    Past Surgical History  Procedure Laterality Date  . Elbow surgery    . Coronary stent placement     Social History:  reports that he has been smoking.  He does not have any smokeless tobacco history on file. He reports that he does not drink alcohol or use illicit drugs. Patient  lives at home & is able to participate in activities of daily living  Allergies  Allergen Reactions  . Penicillins Rash    Has patient had a PCN reaction causing immediate rash, facial/tongue/throat swelling, SOB or lightheadedness with hypotension: Yes Has patient had a PCN reaction causing severe rash involving mucus membranes or skin necrosis: No Has patient had a PCN reaction that required hospitalization No Has patient had a PCN reaction occurring within the last 10 years: No If all of the above answers are "NO", then may proceed with Cephalosporin use.     Family history of hypertension  Prior to Admission medications   Medication Sig Start Date End Date Taking? Authorizing Provider  albuterol (PROAIR HFA) 108 (90 Base) MCG/ACT inhaler Inhale 2 puffs into the lungs every 6 (six) hours as needed. Shortness of breath and wheezing 06/21/12  Yes Historical Provider, MD  ANORO ELLIPTA 62.5-25 MCG/INH AEPB Inhale 1 puff into the lungs daily. 12/16/14  Yes Historical Provider, MD  aspirin EC 81 MG tablet Take 81 mg by mouth daily.   Yes Historical Provider, MD  atenolol (TENORMIN) 50 MG tablet Take 50 mg by mouth 2 (two) times daily.  05/01/12  Yes Historical Provider, MD  BRILINTA 90 MG TABS tablet Take 90 mg by mouth 2 (two) times daily. 01/11/15  Yes Historical Provider, MD  clonazePAM (KLONOPIN) 0.5 MG tablet Take 0.5 mg by mouth 3 (three) times daily. 01/09/15  Yes Historical Provider, MD  finasteride (PROSCAR) 5 MG tablet Take 5 mg by mouth daily. 12/26/14  Yes Historical Provider, MD  folic acid (FOLVITE) 1 MG tablet Take 1 mg by mouth daily. 12/26/14  Yes Historical Provider, MD  hydrochlorothiazide (  HYDRODIURIL) 25 MG tablet Take 25 mg by mouth daily. 05/01/12  Yes Historical Provider, MD  HYDROcodone-acetaminophen (NORCO/VICODIN) 5-325 MG tablet Take 1-2 tablets by mouth every 4 (four) hours as needed. Patient taking differently: Take 1-2 tablets by mouth every 4 (four) hours as needed  for moderate pain or severe pain.  01/31/15  Yes Dione Booze, MD  lisinopril (PRINIVIL,ZESTRIL) 20 MG tablet Take 20 mg by mouth daily. 05/01/12  Yes Historical Provider, MD  losartan (COZAAR) 50 MG tablet Take 50 mg by mouth daily.   Yes Historical Provider, MD  LYRICA 100 MG capsule Take 100-300 mg by mouth 2 (two) times daily. 1 capsule in the morning and 3 capsules at bedtime 01/09/15  Yes Historical Provider, MD  mirtazapine (REMERON) 30 MG tablet Take 30 mg by mouth at bedtime. 12/26/14  Yes Historical Provider, MD  nicotine (NICODERM CQ - DOSED IN MG/24 HOURS) 21 mg/24hr patch Place 21 mg onto the skin daily.   Yes Historical Provider, MD  OLANZapine (ZYPREXA) 2.5 MG tablet Take 2.5 mg by mouth every morning.  12/29/14  Yes Historical Provider, MD  OLANZapine (ZYPREXA) 7.5 MG tablet Take 7.5 mg by mouth at bedtime. 12/29/14  Yes Historical Provider, MD  Omeprazole 20 MG TBEC Take 20 mg by mouth daily. 12/23/14  Yes Historical Provider, MD  oxybutynin (DITROPAN XL) 15 MG 24 hr tablet Take 15 mg by mouth at bedtime.   Yes Historical Provider, MD  pravastatin (PRAVACHOL) 40 MG tablet Take 40 mg by mouth at bedtime. 12/26/14  Yes Historical Provider, MD  risperiDONE (RISPERDAL) 2 MG tablet Take 2 mg by mouth at bedtime. 05/01/12  Yes Historical Provider, MD  sertraline (ZOLOFT) 100 MG tablet Take 100 mg by mouth daily. 12/29/14  Yes Historical Provider, MD  SPIRIVA HANDIHALER 18 MCG inhalation capsule Place 1 puff into inhaler and inhale daily. 11/20/14  Yes Historical Provider, MD  tamsulosin (FLOMAX) 0.4 MG CAPS capsule Take 0.4 mg by mouth every evening. 12/19/14  Yes Historical Provider, MD  traZODone (DESYREL) 100 MG tablet Take 50 mg by mouth at bedtime.  01/11/15  Yes Historical Provider, MD  vitamin B-12 (CYANOCOBALAMIN) 100 MCG tablet Take 100 mcg by mouth daily.   Yes Historical Provider, MD  cyclobenzaprine (FLEXERIL) 10 MG tablet Take 1 tablet (10 mg total) by mouth 3 (three) times  daily. Patient not taking: Reported on 02/03/2015 01/22/15   Ivery Quale, PA-C  naproxen (NAPROSYN) 500 MG tablet Take 1 tablet (500 mg total) by mouth 2 (two) times daily with a meal. Patient not taking: Reported on 01/22/2015 09/05/14   Pauline Aus, PA-C    Physical Exam: BP 155/87 mmHg  Pulse 61  Temp(Src) 97.6 F (36.4 C) (Tympanic)  Resp 19  Ht 5' 8.5" (1.74 m)  Wt 77.111 kg (170 lb)  BMI 25.47 kg/m2  SpO2 99%  General: elderly Caucasian male. Awake and alert and oriented x3. No acute cardiopulmonary distress.  Eyes: Pupils equal, round, reactive to light. Extraocular muscles are intact. Sclerae anicteric and noninjected.  ENT:  Moist mucosal membranes. No mucosal lesions.  Neck: Neck supple without lymphadenopathy. No carotid bruits. No masses palpated.  Cardiovascular: Regular rate with normal S1-S2 sounds. No murmurs, rubs, gallops auscultated. No JVD.  Respiratory: Good respiratory effort with no wheezes, rales, rhonchi. Lungs clear to auscultation bilaterally.  Abdomen: Soft, nontender, nondistended. Active bowel sounds. No masses or hepatosplenomegaly  Skin: Dry, warm to touch. 2+ dorsalis pedis and radial pulses. Musculoskeletal: No calf or leg  pain. All major joints not erythematous nontender.  Psychiatric: Intact judgment and insight.  Neurologic: No focal neurological deficits. Cranial nerves II through XII are grossly intact.           Labs on Admission:  Basic Metabolic Panel:  Recent Labs Lab 02/03/15 1314  NA 130*  K 3.3*  CL 99*  CO2 23  GLUCOSE 78  BUN 19  CREATININE 1.97*  CALCIUM 9.1   Liver Function Tests:  Recent Labs Lab 02/03/15 1314  AST 31  ALT 23  ALKPHOS 80  BILITOT 0.6  PROT 7.0  ALBUMIN 3.7    Recent Labs Lab 02/03/15 1314  LIPASE 34   No results for input(s): AMMONIA in the last 168 hours. CBC:  Recent Labs Lab 02/03/15 1314  WBC 3.3*  NEUTROABS 1.6*  HGB 12.5*  HCT 37.7*  MCV 91.5  PLT 127*   Cardiac  Enzymes: No results for input(s): CKTOTAL, CKMB, CKMBINDEX, TROPONINI in the last 168 hours.  BNP (last 3 results) No results for input(s): BNP in the last 8760 hours.  ProBNP (last 3 results) No results for input(s): PROBNP in the last 8760 hours.  CBG: No results for input(s): GLUCAP in the last 168 hours.  Radiological Exams on Admission: Dg Chest 2 View  02/03/2015  CLINICAL DATA:  Syncope. EXAM: CHEST  2 VIEW COMPARISON:  January 22, 2015. FINDINGS: The heart size and mediastinal contours are within normal limits. No pneumothorax or pleural effusion is noted. Right lung is clear. Stable left basilar scarring is noted. No acute pulmonary disease is noted. The visualized skeletal structures are unremarkable. IMPRESSION: No active cardiopulmonary disease. Electronically Signed   By: Lupita Raider, M.D.   On: 02/03/2015 14:20   Ct Head Wo Contrast  02/03/2015  CLINICAL DATA:  Multiple syncopal episodes. The patient fell twice today due to syncope and head loss of consciousness for greater than 30 minutes and now complains of visual disturbance and occipital headache. EXAM: CT HEAD WITHOUT CONTRAST CT CERVICAL SPINE WITHOUT CONTRAST TECHNIQUE: Multidetector CT imaging of the head and cervical spine was performed following the standard protocol without intravenous contrast. Multiplanar CT image reconstructions of the cervical spine were also generated. COMPARISON:  None FINDINGS: CT HEAD FINDINGS There is a 4 mm colloid cyst in the anterior aspect of the roof of the third ventricle immediately adjacent to the foramen of Monro. No other mass lesions. No acute intracranial hemorrhage or infarction. Brain parenchyma is otherwise normal. No acute osseous abnormality. Old deformity of the nasal bone. CT CERVICAL SPINE FINDINGS There is no fracture or subluxation or prevertebral soft tissue swelling. There is multilevel degenerative disc disease, most prominent at C4-5 on the left. There is multilevel  degenerative disc disease with right foraminal stenosis at C3-4, left foraminal stenosis at C4-5, and right foraminal stenosis at C5-6. IMPRESSION: 1. No acute intracranial abnormality. 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 These lesions can predispose the patient to obstructive hydrocephalus and sudden death. Neurosurgical consultation suggested. 2. No acute abnormality of the cervical spine. Multilevel degenerative disc and joint disease. Electronically Signed   By: Francene Boyers M.D.   On: 02/03/2015 14:18   Ct Cervical Spine Wo Contrast  02/03/2015  CLINICAL DATA:  Multiple syncopal episodes. The patient fell twice today due to syncope and head loss of consciousness for greater than 30 minutes and now complains of visual disturbance and occipital headache. EXAM: CT HEAD WITHOUT CONTRAST CT CERVICAL SPINE WITHOUT CONTRAST TECHNIQUE: Multidetector  CT imaging of the head and cervical spine was performed following the standard protocol without intravenous contrast. Multiplanar CT image reconstructions of the cervical spine were also generated. COMPARISON:  None FINDINGS: CT HEAD FINDINGS There is a 4 mm colloid cyst in the anterior aspect of the roof of the third ventricle immediately adjacent to the foramen of Monro. No other mass lesions. No acute intracranial hemorrhage or infarction. Brain parenchyma is otherwise normal. No acute osseous abnormality. Old deformity of the nasal bone. CT CERVICAL SPINE FINDINGS There is no fracture or subluxation or prevertebral soft tissue swelling. There is multilevel degenerative disc disease, most prominent at C4-5 on the left. There is multilevel degenerative disc disease with right foraminal stenosis at C3-4, left foraminal stenosis at C4-5, and right foraminal stenosis at C5-6. IMPRESSION: 1. No acute intracranial abnormality. 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 These lesions can predispose the patient to obstructive hydrocephalus and sudden death.  Neurosurgical consultation suggested. 2. No acute abnormality of the cervical spine. Multilevel degenerative disc and joint disease. Electronically Signed   By: Francene Boyers M.D.   On: 02/03/2015 14:18    EKG: Independently reviewed. sinus rhythm with slightly prolonged PR interval at 0.224. No acute ST elevation or depression.  Assessment/Plan Present on Admission:  . Syncope . Acute renal injury (HCC) . Essential hypertension  This patient was discussed with the ED physician, including pertinent vitals, physical exam findings, labs, and imaging.  We also discussed care given by the ED provider.  #1 syncope  Admit to telemetry  It appears that his syncope may be related to excessive antihypertensive use, given that the patient was on 4 antihypertensives his blood pressure was 86/60 when he first presented to the hospital.  We'll hold to a visit but hypertensives: Cozaar and hydrochlorothiazide  Continue atenolol and lisinopril  Echocardiogram in the morning  Continue IV fluids overnight  Recheck electrolytes in the morning   Orthostatics  #2 acute renal injury   likely secondary to lisinopril and Cozaar use  Hold Cozaar and continue lisinopril  Continue IV fluids overnight  #3 essential hypertension    per patient difficult to control  We'll hold the above hypertensives and recheck in the morning  #4 major depression disorder   continue home medications #5 colloid cyst  This was seen on CT  Neurosurgery reports that this is small and inconsequential regarding the patient's current symptoms  He should continue with outpatient follow-up for this   DVT prophylaxis: Lovenox   Consultants: none  Code Status: full code   Family Communication: wife in the room    Disposition Plan: admission with telemetry   Levie Heritage, DO [facility] Pager [phone]

## 2015-02-03 NOTE — ED Notes (Addendum)
Went in to round on patient. Pt requesting something to drink. Pt sister at bedside. RN informed patient of NPO status. Pt sister came out of room and talked with EDP who also said pt was to remain NPO until neurosurgery consult completed. Pt sister goes back to patient bedside and offers patient something to drink. This RN goes into room and tells patient that she is not to offer the patient anything that he can't have anything to drink. Pt sister becomes argumentative and states," you can't come in here and tell me what to do". This RN reinforces that pt to remain NPO. Pt sister reports well this curtain does not need to be closed. This RN informed patient sister that for privacy reasons the curtain needed to remain closed. Pt sister came out of room and was looking into other patient rooms and stated," their curtains are open". This RN informed pt sister that that room was not in this RN assignment and that was not this RN decision but the curtain needed to remain closed in this room for pt privacy. Pt sister raised voice and threatened this RN stating,"I will slap the hell out of you if you do not get out of this room." This RN left pt room and let patient sister calm down. Pt sister proceeds to talk with patient and states "I will slap the hell out of that nurse if she comes back in this room." Pt sister leaves pt bedside and is greeted by security. Pt sister escorted off hospital property.

## 2015-02-03 NOTE — ED Notes (Signed)
Patient requesting pain medication. Verbal order from Dr Deretha Emory for tylenol to be given.

## 2015-02-04 ENCOUNTER — Inpatient Hospital Stay (HOSPITAL_COMMUNITY): Payer: Medicaid - Out of State

## 2015-02-04 LAB — BASIC METABOLIC PANEL
Anion gap: 7 (ref 5–15)
BUN: 10 mg/dL (ref 6–20)
CHLORIDE: 112 mmol/L — AB (ref 101–111)
CO2: 22 mmol/L (ref 22–32)
Calcium: 8.8 mg/dL — ABNORMAL LOW (ref 8.9–10.3)
Creatinine, Ser: 1.16 mg/dL (ref 0.61–1.24)
GFR calc non Af Amer: >60 mL/min (ref >60)
Glucose, Bld: 127 mg/dL — ABNORMAL HIGH (ref 65–99)
POTASSIUM: 3.2 mmol/L — AB (ref 3.5–5.1)
SODIUM: 141 mmol/L (ref 135–145)

## 2015-02-04 LAB — GLUCOSE, CAPILLARY: GLUCOSE-CAPILLARY: 98 mg/dL (ref 65–99)

## 2015-02-04 MED ORDER — ACETAMINOPHEN 325 MG PO TABS: 650.0000 mg | ORAL_TABLET | Freq: Four times a day (QID) | ORAL | Status: DC | PRN

## 2015-02-04 NOTE — Progress Notes (Signed)
*  PRELIMINARY RESULTS* Echocardiogram 2D Echocardiogram has been performed.  Doristine Section 02/04/2015, 8:58 AM

## 2015-02-04 NOTE — Progress Notes (Signed)
PROGRESS NOTE  Casey Walker ZOX:[id number] DOB: 1955/05/21 DOA: 02/03/2015 PCP: Arlina Robes, MD  Summary: 75 yom PMHx of HTN, COPD, hepatitis C, and CAD presented after having multiple syncopal episodes for the last 3-4 weeks. He denies any prodromal symptoms such as CP, SOB, or dizziness. Patient reports his episodes have been witnessed and there are no tremors or convulsions. He does not a post-ictal period. While in the ED, labs revealed a lactic acid of 2.03, sodium of 130, Potassium of 3.3, Creatinine of 1.97, and unremarkable CBC. Head CT negative for acute changes. He was admitted for further management.   Assessment/Plan: 1. Syncope, thought to be related to hypotension secondary to multiple antihypertensives. Head CT unremarkable. Telemetry unrevealing, EKG nonacute.  2. Acute kidney injury, possibly related to dehydration or Cozaar and lisinopril. These agents discontinued. 3. Essential hypertension. Hypotensive on admission, resolved with IV fluids. Therapy narrowed to beta blocker. 4. Major depressive disorder, continue home medications 5. 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 EDP discussed with neurosurgery Dr. Yetta Barre who felt this was insignificant and recommended no follow-up.    Improved. Follow-up 2-D echocardiogram results. Not orthostatic today and kidney function has returned normal. Likely related to antihypertensive therapy. Anticipate discharge home in the morning  Code Status: Full DVT prophylaxis: Lovenox Family Communication: No family at bedside. Disposition Plan:   Brendia Sacks, MD  [facility]  Pager [phone] If 7PM-7AM, please contact night-coverage at [email or web address], password [facility] 02/04/2015, 6:44 AM  LOS: 1 day   Consultants:    Procedures:    Antibiotics:    HPI/Subjective: Feels a little better than yesterday. Has pain in his head, neck and ribs (where he has a fx).   Objective: Filed Vitals:   02/03/15 2015  02/03/15 2026 02/03/15 2105 02/04/15 0215  BP:    119/76  Pulse:   55 55  Temp: 97.6 F (36.4 C)   97.5 F (36.4 C)  TempSrc: Oral   Oral  Resp: 20   17  Height:      Weight:      SpO2: 95% 99%  99%    Intake/Output Summary (Last 24 hours) at 02/04/15 0644 Last data filed at 02/04/15 0240  Gross per 24 hour  Intake    480 ml  Output   2500 ml  Net  -2020 ml     Filed Weights   02/03/15 1229 02/03/15 1830  Weight: 77.111 kg (170 lb) 76 kg (167 lb 8.8 oz)    Exam:     VSS, afebrile General:  Appears calm and comfortable Cardiovascular: RRR, no m/r/g. No LE edema. Telemetry: SR, no arrhythmias  Respiratory: CTA bilaterally, no w/r/r. Normal respiratory effort. Abdomen: soft, ntnd Skin: Multiple tattoos Musculoskeletal: grossly normal tone BUE/BLE Psychiatric: grossly normal mood and affect, speech fluent and appropriate Neurologic: grossly non-focal.  New data reviewed:  Potassium 3.2  Creatinine has normalized.  Orthostatics unremarkable.  Pertinent data since admission:  Lactic acid 2.03  Scheduled Meds: . aspirin EC  81 mg Oral Daily  . atenolol  50 mg Oral BID  . clonazePAM  0.5 mg Oral TID  . enoxaparin (LOVENOX) injection  40 mg Subcutaneous Q24H  . finasteride  5 mg Oral Daily  . lisinopril  20 mg Oral Daily  . mirtazapine  30 mg Oral QHS  . nicotine  21 mg Transdermal Daily  . OLANZapine  2.5 mg Oral q morning - 10a  . OLANZapine  7.5 mg Oral QHS  . oxybutynin  15  mg Oral QHS  . pantoprazole  40 mg Oral Daily  . pravastatin  40 mg Oral QHS  . pregabalin  100 mg Oral Daily   And  . pregabalin  300 mg Oral QHS  . risperiDONE  2 mg Oral QHS  . sertraline  100 mg Oral Daily  . sodium chloride  1,000 mL Intravenous Once  . sodium chloride  3 mL Intravenous Q12H  . tamsulosin  0.4 mg Oral QPM  . ticagrelor  90 mg Oral BID  . tiotropium  18 mcg Inhalation Daily  . traZODone  50 mg Oral QHS  . Umeclidinium-Vilanterol  1 puff Inhalation Daily     Continuous Infusions: . sodium chloride 100 mL/hr at 02/03/15 1559    Active Problems:   Syncope   Essential hypertension   Acute renal injury (HCC)   Time spent 20 minutes   By signing my name below, I, Burnett Harry attest that this documentation has been prepared under the direction and in the presence of Brendia Sacks, MD Electronically signed: Burnett Harry, Scribe.  02/04/2015 11:10am   I personally performed the services described in this documentation. All medical record entries made by the scribe were at my direction. I have reviewed the chart and agree that the record reflects my personal performance and is accurate and complete. Brendia Sacks, MD

## 2015-02-05 ENCOUNTER — Encounter (HOSPITAL_COMMUNITY): Payer: Self-pay | Admitting: Family Medicine

## 2015-02-05 DIAGNOSIS — N179 Acute kidney failure, unspecified: Secondary | ICD-10-CM

## 2015-02-05 LAB — GLUCOSE, CAPILLARY: GLUCOSE-CAPILLARY: 85 mg/dL (ref 65–99)

## 2015-02-05 MED ORDER — NAPROXEN 500 MG PO TABS: 500.0000 mg | ORAL_TABLET | Freq: Two times a day (BID) | ORAL | Status: DC

## 2015-02-05 NOTE — Progress Notes (Signed)
BP this am is 177/95 and patient is asymptomatic.  Dr. Onalee Hua notified via text page and when she returned the call she did not give and further orders since patient has BP medications ordered to be given with morning medications.  Nursing staff to continue to monitor.

## 2015-02-05 NOTE — Discharge Summary (Signed)
Physician Discharge Summary  Casey Walker PIR:[id number] DOB: Nov 05, 1955 DOA: 02/03/2015  PCP: Arlina Robes, MD  Admit date: 02/03/2015 Discharge date: 02/05/2015  Recommendations for Outpatient Follow-up:  1. Follow-up syncope, seizures discussion below. Antihypertensives adjusted as below. 2. Colloid cyst of brain, see below. Consider outpatient neurosurgery consult.   Discharge Diagnoses:  1. Syncope 2. AKI 3. Essential HTN 4. Colloid cyst  Discharge Condition: Improved Disposition: Home  Diet recommendation: Heart healthy  Filed Weights   02/03/15 1830 02/04/15 0615 02/05/15 6301  Weight: 76 kg (167 lb 8.8 oz) 74.889 kg (165 lb 1.6 oz) 77.429 kg (170 lb 11.2 oz)    History of present illness:  41 yom PMHx of HTN, COPD, hepatitis C, and CAD presented after having multiple syncopal episodes for the last 3-4 weeks. Initial evaluation was unrevealing and he was referred for observation.  Hospital Course:  Observed without recurrent syncope. Losartan and lisinopril were discontinued. Beta blocker was continued. Hypotension resolved rapidly as did symptoms of dizziness. Ambulating without difficulty. Telemetry was unrevealing and echocardiogram was reassuring. Hospitalization was uncomplicated. Individual issues as below.  Individual issues as below:  1. Syncope, resolved. Hypotensive on admission, on multiple antihypertensives including lisinopril and losartan. Likely secondary to multiple antihypertensives. Head CT unremarkable. Telemetry unrevealing. EKG nonacute. Echocardiogram reassuring. 2. Acute kidney injury. Resolved with IV fluids. Discontinue Cozaar and lisinopril for now. 3. Essential hypertension. Resolved with reduction of blood pressure medications. 4. 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 EDP discussed with neurosurgery Dr. Yetta Barre who felt this was insignificant and recommended no follow-up.   Overall improved. Workup unrevealing. No recurrent  symptoms. I suspect his syncope is orthostatic in nature related to antihypertensives and dehydration. There is no evidence of suggest seizure by history. He should follow-up with his primary care physician. In the meantime no driving, ladders, power tools or swimming until follow-up at which time if he's had no syncope he could likely be cleared.  Consultants: 5. none  Procedures:  ECHO Study Conclusions  - Left ventricle: The cavity size was normal. There was mild concentric hypertrophy. Systolic function was normal. The estimated ejection fraction was in the range of 55% to 60%. Wall motion was normal; there were no regional wall motion abnormalities. Doppler parameters are consistent with abnormal left ventricular relaxation (grade 1 diastolic dysfunction). - Aortic valve: Valve area (Vmax): 3.21 cm^2. - Aortic root: The aortic root was mildly dilated.  Antibiotics:  None   Discharge Instructions Discharge Instructions    Diet - low sodium heart healthy    Complete by:  As directed      Discharge instructions    Complete by:  As directed   Call your physician or seek immediate medical attention for passing out, pain, fever, shortness of breath or worsening of condition. No driving, ladders, power tools or swimming until cleared by your physician.     Increase activity slowly    Complete by:  As directed             Discharge Medication List as of 02/05/2015 12:43 PM    CONTINUE these medications which have CHANGED   Details  naproxen (NAPROSYN) 500 MG tablet Take 1 tablet (500 mg total) by mouth 2 (two) times daily with a meal., Starting 02/05/2015, Until Discontinued, Print      CONTINUE these medications which have NOT CHANGED   Details  albuterol (PROAIR HFA) 108 (90 Base) MCG/ACT inhaler Inhale 2 puffs into the lungs every 6 (six) hours as needed. Shortness  of breath and wheezing, Starting 06/21/2012, Until Discontinued, Historical Med    ANORO ELLIPTA  62.5-25 MCG/INH AEPB Inhale 1 puff into the lungs daily., Starting 12/16/2014, Until Discontinued, Historical Med    aspirin EC 81 MG tablet Take 81 mg by mouth daily., Until Discontinued, Historical Med    atenolol (TENORMIN) 50 MG tablet Take 50 mg by mouth 2 (two) times daily. , Starting 05/01/2012, Until Discontinued, Historical Med    BRILINTA 90 MG TABS tablet Take 90 mg by mouth 2 (two) times daily., Starting 01/11/2015, Until Discontinued, Historical Med    clonazePAM (KLONOPIN) 0.5 MG tablet Take 0.5 mg by mouth 3 (three) times daily., Starting 01/09/2015, Until Discontinued, Historical Med    finasteride (PROSCAR) 5 MG tablet Take 5 mg by mouth daily., Starting 12/26/2014, Until Discontinued, Historical Med    folic acid (FOLVITE) 1 MG tablet Take 1 mg by mouth daily., Starting 12/26/2014, Until Discontinued, Historical Med    hydrochlorothiazide (HYDRODIURIL) 25 MG tablet Take 25 mg by mouth daily., Starting 05/01/2012, Until Discontinued, Historical Med    HYDROcodone-acetaminophen (NORCO/VICODIN) 5-325 MG tablet Take 1-2 tablets by mouth every 4 (four) hours as needed., Starting 01/31/2015, Until Discontinued, Print    LYRICA 100 MG capsule Take 100-300 mg by mouth 2 (two) times daily. 1 capsule in the morning and 3 capsules at bedtime, Starting 01/09/2015, Until Discontinued, Historical Med    mirtazapine (REMERON) 30 MG tablet Take 30 mg by mouth at bedtime., Starting 12/26/2014, Until Discontinued, Historical Med    nicotine (NICODERM CQ - DOSED IN MG/24 HOURS) 21 mg/24hr patch Place 21 mg onto the skin daily., Until Discontinued, Historical Med    !! OLANZapine (ZYPREXA) 2.5 MG tablet Take 2.5 mg by mouth every morning. , Starting 12/29/2014, Until Discontinued, Historical Med    !! OLANZapine (ZYPREXA) 7.5 MG tablet Take 7.5 mg by mouth at bedtime., Starting 12/29/2014, Until Discontinued, Historical Med    Omeprazole 20 MG TBEC Take 20 mg by mouth daily., Starting  12/23/2014, Until Discontinued, Historical Med    oxybutynin (DITROPAN XL) 15 MG 24 hr tablet Take 15 mg by mouth at bedtime., Until Discontinued, Historical Med    pravastatin (PRAVACHOL) 40 MG tablet Take 40 mg by mouth at bedtime., Starting 12/26/2014, Until Discontinued, Historical Med    risperiDONE (RISPERDAL) 2 MG tablet Take 2 mg by mouth at bedtime., Starting 05/01/2012, Until Discontinued, Historical Med    sertraline (ZOLOFT) 100 MG tablet Take 100 mg by mouth daily., Starting 12/29/2014, Until Discontinued, Historical Med    SPIRIVA HANDIHALER 18 MCG inhalation capsule Place 1 puff into inhaler and inhale daily., Starting 11/20/2014, Until Discontinued, Historical Med    tamsulosin (FLOMAX) 0.4 MG CAPS capsule Take 0.4 mg by mouth every evening., Starting 12/19/2014, Until Discontinued, Historical Med    traZODone (DESYREL) 100 MG tablet Take 50 mg by mouth at bedtime. , Starting 01/11/2015, Until Discontinued, Historical Med    vitamin B-12 (CYANOCOBALAMIN) 100 MCG tablet Take 100 mcg by mouth daily., Until Discontinued, Historical Med     !! - Potential duplicate medications found. Please discuss with provider.    STOP taking these medications     lisinopril (PRINIVIL,ZESTRIL) 20 MG tablet      losartan (COZAAR) 50 MG tablet      cyclobenzaprine (FLEXERIL) 10 MG tablet        Allergies  Allergen Reactions  . Penicillins Rash    Has patient had a PCN reaction causing immediate rash, facial/tongue/throat swelling, SOB or lightheadedness  with hypotension: Yes Has patient had a PCN reaction causing severe rash involving mucus membranes or skin necrosis: No Has patient had a PCN reaction that required hospitalization No Has patient had a PCN reaction occurring within the last 10 years: No If all of the above answers are "NO", then may proceed with Cephalosporin use.     The results of significant diagnostics from this hospitalization (including imaging, microbiology,  ancillary and laboratory) are listed below for reference.    Significant Diagnostic Studies: Dg Chest 2 View  02/03/2015  CLINICAL DATA:  Syncope. EXAM: CHEST  2 VIEW COMPARISON:  January 22, 2015. FINDINGS: The heart size and mediastinal contours are within normal limits. No pneumothorax or pleural effusion is noted. Right lung is clear. Stable left basilar scarring is noted. No acute pulmonary disease is noted. The visualized skeletal structures are unremarkable. IMPRESSION: No active cardiopulmonary disease. Electronically Signed   By: Lupita Raider, M.D.   On: 02/03/2015 14:20   Dg Ribs Unilateral W/chest Right  01/22/2015  CLINICAL DATA:  Patient status post fall down multiple stairs. Right back and left shoulder pain. Initial encounter. EXAM: RIGHT RIBS AND CHEST - 3+ VIEW COMPARISON:  None. FINDINGS: Normal cardiac and mediastinal contours. Tortuosity of the thoracic aorta. No consolidative pulmonary opacities. No pleural effusion or pneumothorax. Bilateral AC joint degenerative changes. Thoracic spine degenerative changes. There is suggestion of a nondisplaced fracture through the posterior aspect of the right eleventh rib. IMPRESSION: Suggestion of nondisplaced fracture through the posterior right eleventh rib. No acute cardiopulmonary process. Electronically Signed   By: Annia Belt M.D.   On: 01/22/2015 14:08   Dg Pelvis 1-2 Views  01/22/2015  CLINICAL DATA:  Status post fall 01/20/2015. Pelvic pain. Initial encounter. EXAM: PELVIS - 1-2 VIEW COMPARISON:  None. FINDINGS: There is no evidence of pelvic fracture or diastasis. No pelvic bone lesions are seen. IMPRESSION: Negative exam. Electronically Signed   By: Drusilla Kanner M.D.   On: 01/22/2015 14:04   Ct Head Wo Contrast  02/03/2015  CLINICAL DATA:  Multiple syncopal episodes. The patient fell twice today due to syncope and head loss of consciousness for greater than 30 minutes and now complains of visual disturbance and occipital  headache. EXAM: CT HEAD WITHOUT CONTRAST CT CERVICAL SPINE WITHOUT CONTRAST TECHNIQUE: Multidetector CT imaging of the head and cervical spine was performed following the standard protocol without intravenous contrast. Multiplanar CT image reconstructions of the cervical spine were also generated. COMPARISON:  None FINDINGS: CT HEAD FINDINGS There is a 4 mm colloid cyst in the anterior aspect of the roof of the third ventricle immediately adjacent to the foramen of Monro. No other mass lesions. No acute intracranial hemorrhage or infarction. Brain parenchyma is otherwise normal. No acute osseous abnormality. Old deformity of the nasal bone. CT CERVICAL SPINE FINDINGS There is no fracture or subluxation or prevertebral soft tissue swelling. There is multilevel degenerative disc disease, most prominent at C4-5 on the left. There is multilevel degenerative disc disease with right foraminal stenosis at C3-4, left foraminal stenosis at C4-5, and right foraminal stenosis at C5-6. IMPRESSION: 1. No acute intracranial abnormality. 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 These lesions can predispose the patient to obstructive hydrocephalus and sudden death. Neurosurgical consultation suggested. 2. No acute abnormality of the cervical spine. Multilevel degenerative disc and joint disease. Electronically Signed   By: Francene Boyers M.D.   On: 02/03/2015 14:18   Ct Cervical Spine Wo Contrast  02/03/2015  CLINICAL  DATA:  Multiple syncopal episodes. The patient fell twice today due to syncope and head loss of consciousness for greater than 30 minutes and now complains of visual disturbance and occipital headache. EXAM: CT HEAD WITHOUT CONTRAST CT CERVICAL SPINE WITHOUT CONTRAST TECHNIQUE: Multidetector CT imaging of the head and cervical spine was performed following the standard protocol without intravenous contrast. Multiplanar CT image reconstructions of the cervical spine were also generated. COMPARISON:  None FINDINGS:  CT HEAD FINDINGS There is a 4 mm colloid cyst in the anterior aspect of the roof of the third ventricle immediately adjacent to the foramen of Monro. No other mass lesions. No acute intracranial hemorrhage or infarction. Brain parenchyma is otherwise normal. No acute osseous abnormality. Old deformity of the nasal bone. CT CERVICAL SPINE FINDINGS There is no fracture or subluxation or prevertebral soft tissue swelling. There is multilevel degenerative disc disease, most prominent at C4-5 on the left. There is multilevel degenerative disc disease with right foraminal stenosis at C3-4, left foraminal stenosis at C4-5, and right foraminal stenosis at C5-6. IMPRESSION: 1. No acute intracranial abnormality. 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 These lesions can predispose the patient to obstructive hydrocephalus and sudden death. Neurosurgical consultation suggested. 2. No acute abnormality of the cervical spine. Multilevel degenerative disc and joint disease. Electronically Signed   By: Francene Boyers M.D.   On: 02/03/2015 14:18   Dg Shoulder Left  01/22/2015  CLINICAL DATA:  Fall.  Left shoulder pain. EXAM: LEFT SHOULDER - 2+ VIEW COMPARISON:  09/05/2014 FINDINGS: There is no evidence of fracture or dislocation. There is no evidence of arthropathy or other focal bone abnormality. Soft tissues are unremarkable. IMPRESSION: Negative. Electronically Signed   By: Signa Kell M.D.   On: 01/22/2015 12:10   Dg Hand Complete Right  01/31/2015  CLINICAL DATA:  Recent fall, generalized hand pain EXAM: RIGHT HAND - COMPLETE 3+ VIEW COMPARISON:  None available FINDINGS: Retained metallic foreign bodies from a remote injury overlying the right third MCP joint. Bones are mildly osteopenic. Mild diffuse degenerative changes with joint space loss and bony spurring of the first MCP joint as well as the interphalangeal joints diffusely. No acute osseous finding or fracture. IMPRESSION: Degenerative osteoarthritis of the  hand. Remote appearing metallic foreign bodies over the right third MCP joint Osteopenia No definite acute osseous finding by plain radiography Electronically Signed   By: Judie Petit.  Shick M.D.   On: 01/31/2015 16:32     Labs: Basic Metabolic Panel:  Recent Labs Lab 02/03/15 1314 02/04/15 0625  NA 130* 141  K 3.3* 3.2*  CL 99* 112*  CO2 23 22  GLUCOSE 78 127*  BUN 19 10  CREATININE 1.97* 1.16  CALCIUM 9.1 8.8*   Liver Function Tests:  Recent Labs Lab 02/03/15 1314  AST 31  ALT 23  ALKPHOS 80  BILITOT 0.6  PROT 7.0  ALBUMIN 3.7    Recent Labs Lab 02/03/15 1314  LIPASE 34    CBC:  Recent Labs Lab 02/03/15 1314  WBC 3.3*  NEUTROABS 1.6*  HGB 12.5*  HCT 37.7*  MCV 91.5  PLT 127*     CBG:  Recent Labs Lab 02/04/15 0747 02/05/15 0754  GLUCAP 98 85    Principal Problem:   Syncope Active Problems:   Essential hypertension   AKI (acute kidney injury) (HCC)   Time coordinating discharge: 35 minutes  Signed:  Brendia Sacks, MD [facility] 02/05/2015, 12:04 PM    By signing my name below,  I, Burnett Harry attest that this documentation has been prepared under the direction and in the presence of Brendia Sacks, MD Electronically signed: Burnett Harry, Scribe.  02/05/2015  I personally performed the services described in this documentation. All medical record entries made by the scribe were at my direction. I have reviewed the chart and agree that the record reflects my personal performance and is accurate and complete. Brendia Sacks, MD

## 2015-02-05 NOTE — Progress Notes (Signed)
PROGRESS NOTE  Casey Walker LKG:[id number] DOB: 08/21/1955 DOA: 02/03/2015 PCP: Arlina Robes, MD  Summary: 34 yom PMHx of HTN, COPD, hepatitis C, and CAD presented after having multiple syncopal episodes for the last 3-4 weeks. He denies any prodromal symptoms such as CP, SOB, or dizziness. Patient reports his episodes have been witnessed and there are no tremors or convulsions. He does not a post-ictal period. While in the ED, labs revealed a lactic acid of 2.03, sodium of 130, Potassium of 3.3, Creatinine of 1.97, and unremarkable CBC. Head CT negative for acute changes. He was admitted for further management.   Assessment/Plan: 1. Syncope, resolved. Hypotensive on admission, on multiple antihypertensives including lisinopril and losartan. Likely secondary to multiple antihypertensives. Head CT unremarkable. Telemetry unrevealing. EKG nonacute. Echocardiogram reassuring. 2. Acute kidney injury. Resolved with IV fluids. Discontinue Cozaar and lisinopril for now. 3. Essential hypertension. Resolved with reduction of blood pressure medications. 4. 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 EDP discussed with neurosurgery Dr. Yetta Barre who felt this was insignificant and recommended no follow-up.    Overall improved. Workup unrevealing. No recurrent symptoms. I suspect his syncope is orthostatic in nature related to antihypertensives and dehydration. There is no evidence of suggest seizure by history. He should follow-up with his primary care physician. In the meantime no driving, ladders, power tools or swimming until follow-up at which time if he's had no syncope he could likely be cleared.  Code Status: Full DVT prophylaxis: Lovenox Family Communication: No family at bedside. Disposition Plan: Discharge home today.   Brendia Sacks, MD  [facility]  Pager [phone] If 7PM-7AM, please contact night-coverage at [email or web address], password [facility] 02/05/2015, 6:46 AM  LOS: 2 days    Consultants:    Procedures:  ECHO Study Conclusions  - Left ventricle: The cavity size was normal. There was mild concentric hypertrophy. Systolic function was normal. The estimated ejection fraction was in the range of 55% to 60%. Wall motion was normal; there were no regional wall motion abnormalities. Doppler parameters are consistent with abnormal left ventricular relaxation (grade 1 diastolic dysfunction). - Aortic valve: Valve area (Vmax): 3.21 cm^2. - Aortic root: The aortic root was mildly dilated.  Antibiotics:    HPI/Subjective: Feels good. Denies any SOB, CP, nausea, or vomiting. Still has pain where he fractured his rib. Ambulating without difficulty. No syncope. No dizziness.   Objective: Filed Vitals:   02/04/15 1359 02/04/15 1821 02/04/15 2052 02/05/15 0632  BP: 167/93 156/92 157/89 177/95  Pulse: 61 59 65 52  Temp: 97.5 F (36.4 C)  97.5 F (36.4 C) 97.6 F (36.4 C)  TempSrc: Axillary  Oral Oral  Resp: Height:      Weight:    77.429 kg (170 lb 11.2 oz)  SpO2: 100% 98% 96% 100%    Intake/Output Summary (Last 24 hours) at 02/05/15 0646 Last data filed at 02/05/15 0500  Gross per 24 hour  Intake 3726.67 ml  Output   4200 ml  Net -473.33 ml     Filed Weights   02/03/15 1830 02/04/15 0615 02/05/15 7425  Weight: 76 kg (167 lb 8.8 oz) 74.889 kg (165 lb 1.6 oz) 77.429 kg (170 lb 11.2 oz)    Exam:     VSS, afebrile General:  Appears comfortable, calm. Cardiovascular: Regular rate and rhythm, no murmur, rub or gallop. No lower extremity edema. Telemetry: Sinus bradycardia, no arrhythmias  Respiratory: Clear to auscultation bilaterally, no wheezes, rales or rhonchi. Normal respiratory effort. Psychiatric: grossly  normal mood and affect, speech fluent and appropriate  New data reviewed:  CBG stable  Pertinent data since admission:  Lactic acid 2.03  Scheduled Meds: . aspirin EC  81 mg Oral Daily  . atenolol  50 mg  Oral BID  . clonazePAM  0.5 mg Oral TID  . enoxaparin (LOVENOX) injection  40 mg Subcutaneous Q24H  . finasteride  5 mg Oral Daily  . mirtazapine  30 mg Oral QHS  . nicotine  21 mg Transdermal Daily  . OLANZapine  2.5 mg Oral q morning - 10a  . OLANZapine  7.5 mg Oral QHS  . oxybutynin  15 mg Oral QHS  . pantoprazole  40 mg Oral Daily  . pravastatin  40 mg Oral QHS  . pregabalin  100 mg Oral Daily   And  . pregabalin  300 mg Oral QHS  . risperiDONE  2 mg Oral QHS  . sertraline  100 mg Oral Daily  . sodium chloride  3 mL Intravenous Q12H  . tamsulosin  0.4 mg Oral QPM  . ticagrelor  90 mg Oral BID  . tiotropium  18 mcg Inhalation Daily  . traZODone  50 mg Oral QHS  . Umeclidinium-Vilanterol  1 puff Inhalation Daily   Continuous Infusions: . sodium chloride 100 mL/hr at 02/03/15 1559    Principal Problem:   Syncope Active Problems:   Essential hypertension   AKI (acute kidney injury) (HCC)    By signing my name below, I, Burnett Harry attest that this documentation has been prepared under the direction and in the presence of Brendia Sacks, MD Electronically signed: Burnett Harry, Scribe.  02/05/2015 11:57am   I personally performed the services described in this documentation. All medical record entries made by the scribe were at my direction. I have reviewed the chart and agree that the record reflects my personal performance and is accurate and complete. Brendia Sacks, MD

## 2015-04-15 ENCOUNTER — Emergency Department (HOSPITAL_COMMUNITY): Payer: Medicaid - Out of State

## 2015-04-15 ENCOUNTER — Encounter (HOSPITAL_COMMUNITY): Payer: Self-pay | Admitting: *Deleted

## 2015-04-15 ENCOUNTER — Emergency Department (HOSPITAL_COMMUNITY)
Admission: EM | Admit: 2015-04-15 | Discharge: 2015-04-15 | Disposition: A | Discharge status: Home / Self Care | Payer: Medicaid - Out of State | Attending: Emergency Medicine | Admitting: Emergency Medicine

## 2015-04-15 DIAGNOSIS — Y929 Unspecified place or not applicable: Secondary | ICD-10-CM | POA: Insufficient documentation

## 2015-04-15 DIAGNOSIS — S92532A Displaced fracture of distal phalanx of left lesser toe(s), initial encounter for closed fracture: Secondary | ICD-10-CM | POA: Diagnosis not present

## 2015-04-15 DIAGNOSIS — Y999 Unspecified external cause status: Secondary | ICD-10-CM | POA: Diagnosis not present

## 2015-04-15 DIAGNOSIS — Y939 Activity, unspecified: Secondary | ICD-10-CM | POA: Insufficient documentation

## 2015-04-15 DIAGNOSIS — F17200 Nicotine dependence, unspecified, uncomplicated: Secondary | ICD-10-CM | POA: Insufficient documentation

## 2015-04-15 DIAGNOSIS — W04XXXA Fall while being carried or supported by other persons, initial encounter: Secondary | ICD-10-CM | POA: Insufficient documentation

## 2015-04-15 DIAGNOSIS — J449 Chronic obstructive pulmonary disease, unspecified: Secondary | ICD-10-CM | POA: Insufficient documentation

## 2015-04-15 DIAGNOSIS — S99922A Unspecified injury of left foot, initial encounter: Secondary | ICD-10-CM | POA: Diagnosis present

## 2015-04-15 DIAGNOSIS — S92912A Unspecified fracture of left toe(s), initial encounter for closed fracture: Secondary | ICD-10-CM

## 2015-04-15 MED ORDER — IBUPROFEN 800 MG PO TABS
800.0000 mg | ORAL_TABLET | Freq: Once | ORAL | Status: AC
  Administered 2015-04-15: 800 mg via ORAL
  Filled 2015-04-15: qty 1

## 2015-04-15 MED ORDER — HYDROCODONE-ACETAMINOPHEN 5-325 MG PO TABS: ORAL_TABLET | ORAL | Status: DC

## 2015-04-15 MED ORDER — HYDROCODONE-ACETAMINOPHEN 5-325 MG PO TABS
1.0000 | ORAL_TABLET | Freq: Once | ORAL | Status: AC
Start: 2015-04-15 — End: 2015-04-15
  Administered 2015-04-15: 1 via ORAL
  Filled 2015-04-15: qty 1

## 2015-04-15 NOTE — ED Notes (Signed)
Pt states something frozen from his freezer dropped on his left foot 4th toe; this area is swollen and bruised.  Pt is able to ambulate. NAD noted.

## 2015-04-15 NOTE — Discharge Instructions (Signed)
Toe Fracture °A toe fracture is a break in one of the toe bones (phalanges). °HOME CARE °If You Have a Cast: °· Do not stick anything inside the cast to scratch your skin. °· Check the skin around the cast every day. Tell your doctor about any concerns. Do not put lotion on the skin underneath the cast. You may put lotion on dry skin around the edges of the cast. °· Do not put pressure on any part of the cast until it is fully hardened. This may take many hours. °· Keep the cast clean and dry. °Bathing °· Do not take baths, swim, or use a hot tub until your doctor says that you can. Ask your doctor if you can take showers. You may only be allowed to take sponge baths for bathing. °· If your doctor says that bathing and showering are okay, cover the cast or bandage (dressing) with a watertight plastic bag to protect it from water. Do not let the cast or bandage get wet. °Managing Pain, Stiffness, and Swelling °· If you do not have a cast, put ice on the injured area if told by your doctor: °¨ Put ice in a plastic bag. °¨ Place a towel between your skin and the bag. °¨ Leave the ice on for 20 minutes, 2-3 times per day. °· Move your toes often to avoid stiffness and to lessen swelling. °· Raise (elevate) the injured area above the level of your heart while you are sitting or lying down. °Driving °· Do not drive or use heavy machinery while taking pain medicine. °· Do not drive while wearing a cast on a foot that you use for driving. °Activity °· Return to your normal activities as told by your doctor. Ask your doctor what activities are safe for you. °· Perform exercises daily as told by your doctor or therapist. °Safety °· Do not use your leg to support your body weight until your doctor says that you can. Use crutches or other tools to help you move around as told by your doctor. °General Instructions °· If your toe was taped to a toe that is next to it (buddy taping), follow your doctor's instructions for changing  the gauze and tape. Change it more often: °¨ If the gauze and tape get wet. If this happens, dry the space between the toes. °¨ If the gauze and tape are too tight and they cause your toe to become pale or to lose feeling (numb). °· Wear a protective shoe as told by your doctor. If you were not given one, wear sturdy shoes that support your foot. Your shoes should not pinch your toes. Your shoes should not fit tightly against your toes. °· Do not use any tobacco products, including cigarettes, chewing tobacco, or e-cigarettes. Tobacco can delay bone healing. If you need help quitting, ask your doctor. °· Take medicines only as told by your doctor. °· Keep all follow-up visits as told by your doctor. This is important. °GET HELP IF: °· You have a fever. °· Your pain medicine is not helping. °· Your toe feels cold. °· You lose feeling (have numbness) in your toe. °· You still have pain after one week of rest and treatment. °· You still have pain after your doctor has said that you can start walking again. °· You have pain or tingling in your foot, and it is not going away. °· You have loss of feeling in your foot, and it is not going away. °GET   HELP RIGHT AWAY IF: °· You have severe pain. °· You have redness or swelling (inflammation) in your toe, and it is getting worse. °· You have pain or loss of feeling in your toe, and it is getting worse. °· Your toe is blue. °  °This information is not intended to replace advice given to you by your health care provider. Make sure you discuss any questions you have with your health care provider. °  °Document Released: 06/19/2007 Document Revised: 05/17/2014 Document Reviewed: 10/27/2013 °Elsevier Interactive Patient Education ©2016 [employer] ° °

## 2015-04-15 NOTE — ED Provider Notes (Signed)
CSN: [id number]     Arrival date & time 04/15/15  1046 History   First MD Initiated Contact with Patient 04/15/15 1054     Chief Complaint  Patient presents with  . Toe Injury     (Consider location/radiation/quality/duration/timing/severity/associated sxs/prior Treatment) HPI   Casey Walker is a 60 y.o. male who presents to the Emergency Department complaining of left fourth toe pain and swelling. He states symptoms began after dropping a frozen chicken onto his foot. Incident occurred 2 days ago. He complains of continued, throbbing pain to his left fourth toe. He reports bruising and swelling. Pain is worse with weightbearing. He has taken ibuprofen without relief. He denies any other injuries, numbness, fever or open wounds.   Past Medical History  Diagnosis Date  . COPD (chronic obstructive pulmonary disease) (HCC)   . 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 Hepatitis C   . 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 Elbow surgery    . Coronary stent placement     No family history on file. Social History  Substance Use Topics  . Smoking status: Current Every Day Smoker -- 0.25 packs/day  . Smokeless tobacco: None  . Alcohol Use: No    Review of Systems  Constitutional: Negative for fever and chills.  Gastrointestinal: Negative for vomiting and abdominal pain.  Musculoskeletal: Positive for joint swelling and arthralgias.  Skin: Negative for color change and wound.  Neurological: Negative for dizziness, weakness and numbness.  All other systems reviewed and are negative.     Allergies  Penicillins  Home Medications   Prior to Admission medications   Medication Sig Start Date End Date Taking? Authorizing Provider  albuterol (PROAIR HFA) 108 (90 Base) MCG/ACT inhaler Inhale 2 puffs into the lungs every 6 (six) hours as needed. Shortness of breath and wheezing 06/21/12   Historical Provider,  MD  ANORO ELLIPTA 62.5-25 MCG/INH AEPB Inhale 1 puff into the lungs daily. 12/16/14   Historical Provider, MD  aspirin EC 81 MG tablet Take 81 mg by mouth daily.    Historical Provider, MD  atenolol (TENORMIN) 50 MG tablet Take 50 mg by mouth 2 (two) times daily.  05/01/12   Historical Provider, MD  BRILINTA 90 MG TABS tablet Take 90 mg by mouth 2 (two) times daily. 01/11/15   Historical Provider, MD  clonazePAM (KLONOPIN) 0.5 MG tablet Take 0.5 mg by mouth 3 (three) times daily. 01/09/15   Historical Provider, MD  finasteride (PROSCAR) 5 MG tablet Take 5 mg by mouth daily. 12/26/14   Historical Provider, MD  folic acid (FOLVITE) 1 MG tablet Take 1 mg by mouth daily. 12/26/14   Historical Provider, MD  hydrochlorothiazide (HYDRODIURIL) 25 MG tablet Take 25 mg by mouth daily. 05/01/12   Historical Provider, MD  HYDROcodone-acetaminophen (NORCO/VICODIN) 5-325 MG tablet Take 1-2 tablets by mouth every 4 (four) hours as needed. Patient taking differently: Take 1-2 tablets by mouth every 4 (four) hours as needed for moderate pain or severe pain.  01/31/15   Dione Boozeavid Glick, MD  LYRICA 100 MG capsule Take 100-300 mg by mouth 2 (two) times daily. 1 capsule in the morning and 3 capsules at bedtime 01/09/15   Historical Provider, MD  mirtazapine (REMERON) 30 MG tablet Take 30 mg by mouth at bedtime. 12/26/14   Historical Provider, MD  naproxen (NAPROSYN) 500 MG tablet Take 1 tablet (500 mg total) by mouth  2 (two) times daily with a meal. 02/05/15   Standley Brooking, MD  nicotine (NICODERM CQ - DOSED IN MG/24 HOURS) 21 mg/24hr patch Place 21 mg onto the skin daily.    Historical Provider, MD  OLANZapine (ZYPREXA) 2.5 MG tablet Take 2.5 mg by mouth every morning.  12/29/14   Historical Provider, MD  OLANZapine (ZYPREXA) 7.5 MG tablet Take 7.5 mg by mouth at bedtime. 12/29/14   Historical Provider, MD  Omeprazole 20 MG TBEC Take 20 mg by mouth daily. 12/23/14   Historical Provider, MD  oxybutynin (DITROPAN XL) 15 MG 24  hr tablet Take 15 mg by mouth at bedtime.    Historical Provider, MD  pravastatin (PRAVACHOL) 40 MG tablet Take 40 mg by mouth at bedtime. 12/26/14   Historical Provider, MD  risperiDONE (RISPERDAL) 2 MG tablet Take 2 mg by mouth at bedtime. 05/01/12   Historical Provider, MD  sertraline (ZOLOFT) 100 MG tablet Take 100 mg by mouth daily. 12/29/14   Historical Provider, MD  SPIRIVA HANDIHALER 18 MCG inhalation capsule Place 1 puff into inhaler and inhale daily. 11/20/14   Historical Provider, MD  tamsulosin (FLOMAX) 0.4 MG CAPS capsule Take 0.4 mg by mouth every evening. 12/19/14   Historical Provider, MD  traZODone (DESYREL) 100 MG tablet Take 50 mg by mouth at bedtime.  01/11/15   Historical Provider, MD  vitamin B-12 (CYANOCOBALAMIN) 100 MCG tablet Take 100 mcg by mouth daily.    Historical Provider, MD   BP 148/88 mmHg  Pulse 67  Temp(Src) 98 F (36.7 C) (Temporal)  Resp 16  Ht  (1.727 m)  Wt 81.647 kg  BMI 27.38 kg/m2  SpO2 99% Physical Exam  Constitutional: He is oriented to person, place, and time. He appears well-developed and well-nourished. No distress.  HENT:  Head: Normocephalic and atraumatic.  Cardiovascular: Normal rate, regular rhythm and intact distal pulses.   Pulmonary/Chest: Effort normal and breath sounds normal. No respiratory distress.  Musculoskeletal: He exhibits edema and tenderness.  Ecchymosis and mild edema of the left fourth toe. No open wounds. DP pulse is brisk,distal sensation intact.  No bony deformity.  No proximal tenderness.  Neurological: He is alert and oriented to person, place, and time. He exhibits normal muscle tone. Coordination normal.  Skin: Skin is warm and dry.  Nursing note and vitals reviewed.   ED Course  Procedures (including critical care time) Labs Review Labs Reviewed - No data to display  Imaging Review Dg Foot Complete Left  04/15/2015  CLINICAL DATA:  Acute left foot pain after injury 2 days ago. Initial encounter. EXAM:  LEFT FOOT - COMPLETE 3+ VIEW COMPARISON:  None. FINDINGS: Mildly displaced fracture is seen involving the proximal base and distal tuft of the fourth distal phalanx. This appears to be closed and posttraumatic. No other fracture or bony abnormality is noted. Joint spaces are intact. IMPRESSION: Mildly displaced fourth distal phalangeal fracture. Electronically Signed   By: Lupita Raider, M.D.   On: 04/15/2015 11:33   I have personally reviewed and evaluated these images and lab results as part of my medical decision-making.   EKG Interpretation None      MDM   Final diagnoses:  Closed fracture of toe, left, initial encounter    Patient with localized tenderness to the left fourth toe. X-ray is consistent with closed fracture.  Neurovascularly intact  Toes were buddy taped and postop shoe applied. Pain improved. Patient agrees to elevate, ice, and close orthopedic follow-up if  needed.    Pauline Aus, PA-C 04/15/15 1158  Glynn Octave, MD 04/15/15 [phone]

## 2015-10-15 ENCOUNTER — Encounter (HOSPITAL_COMMUNITY): Payer: Self-pay | Admitting: Emergency Medicine

## 2015-10-15 ENCOUNTER — Emergency Department (HOSPITAL_COMMUNITY): Payer: Medicaid - Out of State

## 2015-10-15 ENCOUNTER — Emergency Department (HOSPITAL_COMMUNITY)
Admission: EM | Admit: 2015-10-15 | Discharge: 2015-10-15 | Disposition: A | Discharge status: Home / Self Care | Payer: Medicaid - Out of State | Attending: Emergency Medicine | Admitting: Emergency Medicine

## 2015-10-15 DIAGNOSIS — Z79899 Other long term (current) drug therapy: Secondary | ICD-10-CM | POA: Insufficient documentation

## 2015-10-15 DIAGNOSIS — S4992XA Unspecified injury of left shoulder and upper arm, initial encounter: Secondary | ICD-10-CM | POA: Diagnosis present

## 2015-10-15 DIAGNOSIS — W109XXA Fall (on) (from) unspecified stairs and steps, initial encounter: Secondary | ICD-10-CM | POA: Insufficient documentation

## 2015-10-15 DIAGNOSIS — Y929 Unspecified place or not applicable: Secondary | ICD-10-CM | POA: Insufficient documentation

## 2015-10-15 DIAGNOSIS — Y939 Activity, unspecified: Secondary | ICD-10-CM | POA: Diagnosis not present

## 2015-10-15 DIAGNOSIS — Y999 Unspecified external cause status: Secondary | ICD-10-CM | POA: Diagnosis not present

## 2015-10-15 DIAGNOSIS — S40012A Contusion of left shoulder, initial encounter: Secondary | ICD-10-CM | POA: Diagnosis not present

## 2015-10-15 DIAGNOSIS — M549 Dorsalgia, unspecified: Secondary | ICD-10-CM | POA: Insufficient documentation

## 2015-10-15 DIAGNOSIS — W19XXXA Unspecified fall, initial encounter: Secondary | ICD-10-CM

## 2015-10-15 DIAGNOSIS — I10 Essential (primary) hypertension: Secondary | ICD-10-CM | POA: Diagnosis not present

## 2015-10-15 DIAGNOSIS — R51 Headache: Secondary | ICD-10-CM | POA: Diagnosis not present

## 2015-10-15 DIAGNOSIS — G8929 Other chronic pain: Secondary | ICD-10-CM | POA: Insufficient documentation

## 2015-10-15 DIAGNOSIS — Z7982 Long term (current) use of aspirin: Secondary | ICD-10-CM | POA: Diagnosis not present

## 2015-10-15 DIAGNOSIS — F17200 Nicotine dependence, unspecified, uncomplicated: Secondary | ICD-10-CM | POA: Diagnosis not present

## 2015-10-15 DIAGNOSIS — J449 Chronic obstructive pulmonary disease, unspecified: Secondary | ICD-10-CM | POA: Insufficient documentation

## 2015-10-15 MED ORDER — CYCLOBENZAPRINE HCL 10 MG PO TABS: 10.0000 mg | ORAL_TABLET | Freq: Two times a day (BID) | ORAL | 0 refills | Status: DC | PRN

## 2015-10-15 MED ORDER — KETOROLAC TROMETHAMINE 60 MG/2ML IM SOLN
60.0000 mg | Freq: Once | INTRAMUSCULAR | Status: AC
  Administered 2015-10-15: 60 mg via INTRAMUSCULAR
  Filled 2015-10-15: qty 2

## 2015-10-15 NOTE — ED Provider Notes (Signed)
[facility]-EMERGENCY DEPT Provider Note   CSN: [id number] Arrival date & time: 10/15/15  1124     History   Chief Complaint Chief Complaint  Patient presents with  . Fall    HPI Casey Walker is a 60 y.o. male.  Patient is 60 yo M with no prior shoulder injuries, presenting to ED with chief complaint of left shoulder pain after mechanical trip and fall down approximately 15 steps 2 days ago. He states he tripped upwards and slid down the stairs, landing on his left shoulder and bumping his head. Denies any LOC, confusion, dizziness, visual changes, or neurologic symptoms after the fall. His only complaint is worsening pain with movement of left shoulder, especially overhead movements. Patient takes Norco/Vicodin for chronic neck and pain, but it did not provide relief.      Past Medical History:  Diagnosis Date  . 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 COPD (chronic obstructive pulmonary disease) (HCC)   . Hepatitis C   . Kidney disorder    due to hepatitis per patient    Patient Active Problem List   Diagnosis Date Noted  . AKI (acute kidney injury) (HCC) 02/05/2015  . Syncope 02/03/2015  . Major depression 02/03/2015  . 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 Kidney disorder   . HCV (hepatitis C virus) 11/08/2010  . Arteriosclerosis of coronary artery 10/31/2010  . Essential hypertension 10/31/2010  . Seizure (HCC) 10/31/2010  . Current tobacco use 10/31/2010    Past Surgical History:  Procedure Laterality Date  . CORONARY STENT PLACEMENT    . ELBOW SURGERY         Home Medications    Prior to Admission medications   Medication Sig Start Date End Date Taking? Authorizing Provider  albuterol (PROAIR HFA) 108 (90 Base) MCG/ACT inhaler Inhale 2 puffs into the lungs every 6 (six) hours as needed. Shortness of breath and wheezing 06/21/12   Historical Provider, MD  ANORO ELLIPTA 62.5-25 MCG/INH AEPB Inhale 1 puff into the lungs daily. 12/16/14    Historical Provider, MD  aspirin EC 81 MG tablet Take 81 mg by mouth daily.    Historical Provider, MD  atenolol (TENORMIN) 50 MG tablet Take 50 mg by mouth 2 (two) times daily.  05/01/12   Historical Provider, MD  BRILINTA 90 MG TABS tablet Take 90 mg by mouth 2 (two) times daily. 01/11/15   Historical Provider, MD  clonazePAM (KLONOPIN) 0.5 MG tablet Take 0.5 mg by mouth 3 (three) times daily. 01/09/15   Historical Provider, MD  finasteride (PROSCAR) 5 MG tablet Take 5 mg by mouth daily. 12/26/14   Historical Provider, MD  folic acid (FOLVITE) 1 MG tablet Take 1 mg by mouth daily. 12/26/14   Historical Provider, MD  hydrochlorothiazide (HYDRODIURIL) 25 MG tablet Take 25 mg by mouth daily. 05/01/12   Historical Provider, MD  HYDROcodone-acetaminophen (NORCO/VICODIN) 5-325 MG tablet Take one tab po q 4-6 hrs prn pain 04/15/15   Tammy Triplett, PA-C  LYRICA 100 MG capsule Take 100-300 mg by mouth 2 (two) times daily. 1 capsule in the morning and 3 capsules at bedtime 01/09/15   Historical Provider, MD  mirtazapine (REMERON) 30 MG tablet Take 30 mg by mouth at bedtime. 12/26/14   Historical Provider, MD  naproxen (NAPROSYN) 500 MG tablet Take 1 tablet (500 mg total) by mouth 2 (two) times daily with a meal. 02/05/15   Standley Brooking, MD  Omeprazole 20 MG TBEC Take 20 mg  by mouth daily. 12/23/14   Historical Provider, MD  oxybutynin (DITROPAN XL) 15 MG 24 hr tablet Take 15 mg by mouth at bedtime.    Historical Provider, MD  pravastatin (PRAVACHOL) 40 MG tablet Take 40 mg by mouth at bedtime. 12/26/14   Historical Provider, MD  risperiDONE (RISPERDAL) 2 MG tablet Take 2 mg by mouth at bedtime. 05/01/12   Historical Provider, MD  sertraline (ZOLOFT) 100 MG tablet Take 100 mg by mouth daily. 12/29/14   Historical Provider, MD  SPIRIVA HANDIHALER 18 MCG inhalation capsule Place 1 puff into inhaler and inhale daily. 11/20/14   Historical Provider, MD  tamsulosin (FLOMAX) 0.4 MG CAPS capsule Take 0.4 mg by  mouth every evening. 12/19/14   Historical Provider, MD  traZODone (DESYREL) 100 MG tablet Take 50 mg by mouth at bedtime.  01/11/15   Historical Provider, MD  vitamin B-12 (CYANOCOBALAMIN) 100 MCG tablet Take 100 mcg by mouth daily.    Historical Provider, MD    Family History History reviewed. No pertinent family history.  Social History Social History  Substance Use Topics  . Smoking status: Current Every Day Smoker    Packs/day: 0.25  . Smokeless tobacco: Former NeurosurgeonUser  . Alcohol use No     Allergies   Penicillins   Review of Systems Review of Systems  Eyes: Negative for visual disturbance.  Musculoskeletal: Positive for back pain (chronic) and neck pain (chronic). Negative for gait problem, joint swelling and neck stiffness.  Neurological: Positive for headaches (mild frontal headache). Negative for dizziness, syncope, weakness and numbness.     Physical Exam Updated Vital Signs BP 148/89 (BP Location: Right Arm)   Pulse 64   Temp 97.4 F (36.3 C) (Oral)   Resp 18   Ht 5\' 8"  (1.727 m)   Wt 81.6 kg   SpO2 100%   BMI 27.37 kg/m   Physical Exam  Constitutional: He appears well-developed and well-nourished. No distress.  HENT:  Head: Normocephalic and atraumatic. Head is without raccoon's eyes, without Battle's sign, without abrasion and without contusion.  Mouth/Throat: Oropharynx is clear and moist.  Eyes: Conjunctivae and EOM are normal. Pupils are equal, round, and reactive to light.  Neck: Normal range of motion.  No cervical tenderness, crepitus, or step offs. Mild left paraspinal and trapezius muscle tenderness  Cardiovascular: Normal rate and intact distal pulses.   Pulmonary/Chest: Effort normal. No respiratory distress. He exhibits no tenderness.  Abdominal: Soft. There is no tenderness.  Musculoskeletal: Normal range of motion.  FROM left shoulder. Mild tenderness on palpation, but no obvious deformity or dislocation  Neurological: He is alert.    Normal strength in upper and lower extremities bilaterally, strong and equal grip strength Sensation normal to light and sharp touch  Skin: Skin is warm and dry.  Psychiatric: He has a normal mood and affect.     ED Treatments / Results  Labs (all labs ordered are listed, but only abnormal results are displayed) Labs Reviewed - No data to display  EKG  EKG Interpretation None       Radiology No results found.  Procedures Procedures (including critical care time)  Medications Ordered in ED Medications - No data to display   Initial Impression / Assessment and Plan / ED Course  I have reviewed the triage vital signs and the nursing notes.  Pertinent labs & imaging results that were available during my care of the patient were reviewed by me and considered in my medical decision making (see  chart for details).  Clinical Course   Patient is 60 yo M complaining of left shoulder pain after mechanical trip and fall 2 days ago. Patient has FROM with mild TTP at left shoulder and left trapezius. X-ray negative. Patient states he hit his head, but denies LOC or neurologic symptoms. Head is atraumatic and normal neuro exam, and further imaging deferred. Patient received Toradol 60 mg IM for pain, arm sling for comfort, and prescription for Flexeril. Advised to f/u with PCP as needed for pain and reevaluation of shoulder, and to return to ED for new or worsening symptoms including visual changes, headache, dizziness, numbness, or weakness.  Final Clinical Impressions(s) / ED Diagnoses   Final diagnoses:  Contusion of left shoulder, initial encounter  Fall, initial encounter    New Prescriptions Discharge Medication List as of 10/15/2015 12:26 PM    START taking these medications   Details  cyclobenzaprine (FLEXERIL) 10 MG tablet Take 1 tablet (10 mg total) by mouth 2 (two) times daily as needed for muscle spasms., Starting Sun 10/15/2015, Print         Thayden Lemire F de McDonald  II, [place] 10/15/15 1610    Eber Hong, MD 10/17/15 [phone]

## 2015-10-15 NOTE — ED Triage Notes (Signed)
Pt states he fell on his shoulder about two days ago. Pt states he has alot of pain in his left shoulder with the pain getting worse. Pt states he did black out when he fell and hit his head. Pt is A&Ox4

## 2015-10-15 NOTE — ED Provider Notes (Signed)
The patient is a 60 year old male, he reports that 2 days ago while he was walking up the stairs that he had freshly waxed he slipped striking his left shoulder on the upper stair and then slid down the 15 stairs. He did have a head injury to the right frontal area but no loss of consciousness and no ongoing headache. He also complains of some left-sided neck pain. He denies any numbness or weakness of the arms or the legs. On exam the patient has no evidence of hematoma or other head injury, there is no hemotympanum, no malocclusion, no raccoon eyes, no battle sign. He does have mild tenderness over the superior shoulder but no decreased range of motion. He has normal grips at the left upper extremity, normal sensation and normal pulses at the left wrist. He has no spinal tenderness, there is some left paraspinal tenderness and into the trapezius muscle. X-rays reviewed, there is no signs of fracture of the left shoulder. No indication for imaging of the head or cervical spine. The patient will be given some pain medication here and discharged home with a muscle relaxant as well. Overall the patient appears stable and low acuity for discharge. The patient is in agreement with the plan.  Medical screening examination/treatment/procedure(s) were conducted as a shared visit with non-physician practitioner(s) and myself.  I personally evaluated the patient during the encounter.  Clinical Impression:   Final diagnoses:  Contusion of left shoulder, initial encounter  Fall, initial encounter         Eber HongBrian Milton Streicher, MD 10/17/15 1237

## 2015-11-03 ENCOUNTER — Emergency Department (HOSPITAL_COMMUNITY)
Admission: EM | Admit: 2015-11-03 | Discharge: 2015-11-03 | Disposition: A | Discharge status: Home / Self Care | Payer: Medicaid - Out of State | Attending: Emergency Medicine | Admitting: Emergency Medicine

## 2015-11-03 ENCOUNTER — Encounter (HOSPITAL_COMMUNITY): Payer: Self-pay | Admitting: Emergency Medicine

## 2015-11-03 DIAGNOSIS — Z79899 Other long term (current) drug therapy: Secondary | ICD-10-CM | POA: Insufficient documentation

## 2015-11-03 DIAGNOSIS — J449 Chronic obstructive pulmonary disease, unspecified: Secondary | ICD-10-CM | POA: Insufficient documentation

## 2015-11-03 DIAGNOSIS — S46912A Strain of unspecified muscle, fascia and tendon at shoulder and upper arm level, left arm, initial encounter: Secondary | ICD-10-CM

## 2015-11-03 DIAGNOSIS — Y939 Activity, unspecified: Secondary | ICD-10-CM | POA: Insufficient documentation

## 2015-11-03 DIAGNOSIS — S4992XA Unspecified injury of left shoulder and upper arm, initial encounter: Secondary | ICD-10-CM | POA: Diagnosis present

## 2015-11-03 DIAGNOSIS — F17200 Nicotine dependence, unspecified, uncomplicated: Secondary | ICD-10-CM | POA: Diagnosis not present

## 2015-11-03 DIAGNOSIS — I10 Essential (primary) hypertension: Secondary | ICD-10-CM | POA: Insufficient documentation

## 2015-11-03 DIAGNOSIS — W19XXXA Unspecified fall, initial encounter: Secondary | ICD-10-CM | POA: Insufficient documentation

## 2015-11-03 DIAGNOSIS — Z7982 Long term (current) use of aspirin: Secondary | ICD-10-CM | POA: Insufficient documentation

## 2015-11-03 DIAGNOSIS — Y999 Unspecified external cause status: Secondary | ICD-10-CM | POA: Diagnosis not present

## 2015-11-03 DIAGNOSIS — Y929 Unspecified place or not applicable: Secondary | ICD-10-CM | POA: Diagnosis not present

## 2015-11-03 MED ORDER — IBUPROFEN 800 MG PO TABS
800.0000 mg | ORAL_TABLET | Freq: Three times a day (TID) | ORAL | 0 refills | Status: DC
Start: 2015-11-03 — End: 2015-11-13

## 2015-11-03 MED ORDER — HYDROCODONE-ACETAMINOPHEN 5-325 MG PO TABS: ORAL_TABLET | ORAL | 0 refills | Status: DC

## 2015-11-03 NOTE — ED Triage Notes (Signed)
PT states continued left should pain with ROM x1 month after a fall and states was seen in the ED x2 weeks ago with same complaint.

## 2015-11-03 NOTE — ED Provider Notes (Signed)
[facility] Provider Note   CSN: [id number] Arrival date & time: 11/03/15  1236     History   Chief Complaint Chief Complaint  Patient presents with  . Shoulder Pain    HPI Casey Walker is a 60 y.o. male.  The history is provided by the patient. No language interpreter was used.  Shoulder Pain   This is a new problem. The current episode started more than 1 week ago. The problem occurs constantly. The problem has been gradually worsening. The pain is present in the left shoulder. The quality of the pain is described as pounding. The pain is moderate. Associated symptoms include limited range of motion. He has tried nothing for the symptoms. The treatment provided no relief. There has been a history of trauma.  Pt fell over 2 weeks ago.  Pt reports increasing shoulder pain and discomfort.  Pt reports pain with moving arm.  Pt has decreased use of arm.  Past Medical History:  Diagnosis Date  . 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 COPD (chronic obstructive pulmonary disease) (HCC)   . Hepatitis C   . Kidney disorder    due to hepatitis per patient    Patient Active Problem List   Diagnosis Date Noted  . AKI (acute kidney injury) (HCC) 02/05/2015  . Syncope 02/03/2015  . Major depression 02/03/2015  . 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 Kidney disorder   . HCV (hepatitis C virus) 11/08/2010  . Arteriosclerosis of coronary artery 10/31/2010  . Essential hypertension 10/31/2010  . Seizure (HCC) 10/31/2010  . Current tobacco use 10/31/2010    Past Surgical History:  Procedure Laterality Date  . CORONARY STENT PLACEMENT    . ELBOW SURGERY         Home Medications    Prior to Admission medications   Medication Sig Start Date End Date Taking? Authorizing Provider  albuterol (PROAIR HFA) 108 (90 Base) MCG/ACT inhaler Inhale 2 puffs into the lungs every 6 (six) hours as needed. Shortness of breath and wheezing 06/21/12   Historical  Provider, MD  ANORO ELLIPTA 62.5-25 MCG/INH AEPB Inhale 1 puff into the lungs daily. 12/16/14   Historical Provider, MD  aspirin EC 81 MG tablet Take 81 mg by mouth daily.    Historical Provider, MD  atenolol (TENORMIN) 50 MG tablet Take 50 mg by mouth 2 (two) times daily.  05/01/12   Historical Provider, MD  BRILINTA 90 MG TABS tablet Take 90 mg by mouth 2 (two) times daily. 01/11/15   Historical Provider, MD  clonazePAM (KLONOPIN) 0.5 MG tablet Take 0.5 mg by mouth 3 (three) times daily. 01/09/15   Historical Provider, MD  cyclobenzaprine (FLEXERIL) 10 MG tablet Take 1 tablet (10 mg total) by mouth 2 (two) times daily as needed for muscle spasms. 10/15/15   Daryl F de Villier II, PA  finasteride (PROSCAR) 5 MG tablet Take 5 mg by mouth daily. 12/26/14   Historical Provider, MD  folic acid (FOLVITE) 1 MG tablet Take 1 mg by mouth daily. 12/26/14   Historical Provider, MD  hydrochlorothiazide (HYDRODIURIL) 25 MG tablet Take 25 mg by mouth daily. 05/01/12   Historical Provider, MD  HYDROcodone-acetaminophen (NORCO/VICODIN) 5-325 MG tablet Take one tab po q 4-6 hrs prn pain 11/03/15   Elson Areas, PA-C  ibuprofen (ADVIL,MOTRIN) 800 MG tablet Take 1 tablet (800 mg total) by mouth 3 (three) times daily. 11/03/15   Elson Areas, PA-C  LYRICA 100 MG capsule  Take 100-300 mg by mouth 2 (two) times daily. 1 capsule in the morning and 3 capsules at bedtime 01/09/15   Historical Provider, MD  mirtazapine (REMERON) 30 MG tablet Take 30 mg by mouth at bedtime. 12/26/14   Historical Provider, MD  naproxen (NAPROSYN) 500 MG tablet Take 1 tablet (500 mg total) by mouth 2 (two) times daily with a meal. 02/05/15   Standley Brooking, MD  Omeprazole 20 MG TBEC Take 20 mg by mouth daily. 12/23/14   Historical Provider, MD  oxybutynin (DITROPAN XL) 15 MG 24 hr tablet Take 15 mg by mouth at bedtime.    Historical Provider, MD  pravastatin (PRAVACHOL) 40 MG tablet Take 40 mg by mouth at bedtime. 12/26/14   Historical  Provider, MD  risperiDONE (RISPERDAL) 2 MG tablet Take 2 mg by mouth at bedtime. 05/01/12   Historical Provider, MD  sertraline (ZOLOFT) 100 MG tablet Take 100 mg by mouth daily. 12/29/14   Historical Provider, MD  SPIRIVA HANDIHALER 18 MCG inhalation capsule Place 1 puff into inhaler and inhale daily. 11/20/14   Historical Provider, MD  tamsulosin (FLOMAX) 0.4 MG CAPS capsule Take 0.4 mg by mouth every evening. 12/19/14   Historical Provider, MD  traZODone (DESYREL) 100 MG tablet Take 50 mg by mouth at bedtime.  01/11/15   Historical Provider, MD  vitamin B-12 (CYANOCOBALAMIN) 100 MCG tablet Take 100 mcg by mouth daily.    Historical Provider, MD    Family History History reviewed. No pertinent family history.  Social History Social History  Substance Use Topics  . Smoking status: Current Every Day Smoker    Packs/day: 0.25  . Smokeless tobacco: Former Neurosurgeon  . Alcohol use No     Allergies   Penicillins   Review of Systems Review of Systems  All other systems reviewed and are negative.    Physical Exam Updated Vital Signs BP 120/73 (BP Location: Right Arm)   Pulse 70   Temp 98 F (36.7 C) (Temporal)   Resp 16   Ht 5\' 8"  (1.727 m)   Wt 83.5 kg   SpO2 99%   BMI 27.98 kg/m   Physical Exam  Constitutional: He appears well-developed and well-nourished.  HENT:  Head: Normocephalic and atraumatic.  Right Ear: External ear normal.  Eyes: Conjunctivae are normal.  Neck: Neck supple.  Cardiovascular: Normal rate and regular rhythm.   No murmur heard. Pulmonary/Chest: Effort normal and breath sounds normal. No respiratory distress.  Abdominal: Soft. There is no tenderness.  Musculoskeletal: He exhibits tenderness. He exhibits no edema.  Tender left shoulder, pain with range of motion, limit abduction   nv and ns intact,    Neurological: He is alert.  Skin: Skin is warm and dry.  Psychiatric: He has a normal mood and affect.  Nursing note and vitals reviewed.    ED  Treatments / Results  Labs (all labs ordered are listed, but only abnormal results are displayed) Labs Reviewed - No data to display  EKG  EKG Interpretation None       Radiology No results found.  Procedures Procedures (including critical care time)  Medications Ordered in ED Medications - No data to display   Initial Impression / Assessment and Plan / ED Course  I have reviewed the triage vital signs and the nursing notes.  Pertinent labs & imaging results that were available during my care of the patient were reviewed by me and considered in my medical decision making (see chart for details).  Clinical Course    Pt advised to see Orthopaedist,  Pt advised range of mtion exercises, sling for pain.  Final Clinical Impressions(s) / ED Diagnoses   Final diagnoses:  Strain of left shoulder, initial encounter    New Prescriptions Discharge Medication List as of 11/03/2015  1:38 PM    START taking these medications   Details  ibuprofen (ADVIL,MOTRIN) 800 MG tablet Take 1 tablet (800 mg total) by mouth 3 (three) times daily., Starting Fri 11/03/2015, Print         Elson AreasLeslie K Cheo Selvey, PA-C 11/03/15 1359    Samuel JesterKathleen McManus, DO 11/03/15 1559

## 2015-11-07 ENCOUNTER — Encounter (HOSPITAL_COMMUNITY): Payer: Self-pay | Admitting: Emergency Medicine

## 2015-11-07 ENCOUNTER — Emergency Department (HOSPITAL_COMMUNITY)
Admission: EM | Admit: 2015-11-07 | Discharge: 2015-11-07 | Disposition: A | Discharge status: Home / Self Care | Payer: Medicaid - Out of State | Attending: Emergency Medicine | Admitting: Emergency Medicine

## 2015-11-07 DIAGNOSIS — I10 Essential (primary) hypertension: Secondary | ICD-10-CM | POA: Insufficient documentation

## 2015-11-07 DIAGNOSIS — Z79899 Other long term (current) drug therapy: Secondary | ICD-10-CM | POA: Diagnosis not present

## 2015-11-07 DIAGNOSIS — J449 Chronic obstructive pulmonary disease, unspecified: Secondary | ICD-10-CM | POA: Insufficient documentation

## 2015-11-07 DIAGNOSIS — F17200 Nicotine dependence, unspecified, uncomplicated: Secondary | ICD-10-CM | POA: Insufficient documentation

## 2015-11-07 DIAGNOSIS — Z7982 Long term (current) use of aspirin: Secondary | ICD-10-CM | POA: Diagnosis not present

## 2015-11-07 DIAGNOSIS — M25512 Pain in left shoulder: Secondary | ICD-10-CM | POA: Diagnosis present

## 2015-11-07 DIAGNOSIS — G8929 Other chronic pain: Secondary | ICD-10-CM | POA: Insufficient documentation

## 2015-11-07 DIAGNOSIS — I2510 Atherosclerotic heart disease of native coronary artery without angina pectoris: Secondary | ICD-10-CM | POA: Insufficient documentation

## 2015-11-07 DIAGNOSIS — Z791 Long term (current) use of non-steroidal anti-inflammatories (NSAID): Secondary | ICD-10-CM | POA: Diagnosis not present

## 2015-11-07 MED ORDER — DICLOFENAC SODIUM 50 MG PO TBEC: 50.0000 mg | DELAYED_RELEASE_TABLET | Freq: Two times a day (BID) | ORAL | 0 refills | Status: AC

## 2015-11-07 NOTE — ED Triage Notes (Signed)
Pt states he cannot get in for follow-up appt until next Thursday. Pt states he took his last pain medication this am and is requesting a refill.

## 2015-11-07 NOTE — ED Provider Notes (Signed)
[facility] Provider Note   CSN: [id number] Arrival date & time: 11/07/15  1505     History   Chief Complaint Chief Complaint  Patient presents with  . Medication Refill    HPI Casey Walker is a 60 y.o. male.  The history is provided by the patient. No language interpreter was used.  Medication Refill  Medications/supplies requested:  Hydrocodone Reason for request:  Medications ran out Shoulder Pain     Pt complains of continued pain in left shoulder.  Pt reports since ai last saw him he has made an appointment with his MD.  Pt is out of pain medication.  Pt had a shoulder injury 10/1.    Past Medical History:  Diagnosis Date  . 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 COPD (chronic obstructive pulmonary disease) (HCC)   . Hepatitis C   . Kidney disorder    due to hepatitis per patient    Patient Active Problem List   Diagnosis Date Noted  . AKI (acute kidney injury) (HCC) 02/05/2015  . Syncope 02/03/2015  . Major depression 02/03/2015  . 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 Kidney disorder   . HCV (hepatitis C virus) 11/08/2010  . Arteriosclerosis of coronary artery 10/31/2010  . Essential hypertension 10/31/2010  . Seizure (HCC) 10/31/2010  . Current tobacco use 10/31/2010    Past Surgical History:  Procedure Laterality Date  . CORONARY STENT PLACEMENT    . ELBOW SURGERY         Home Medications    Prior to Admission medications   Medication Sig Start Date End Date Taking? Authorizing Provider  albuterol (PROAIR HFA) 108 (90 Base) MCG/ACT inhaler Inhale 2 puffs into the lungs every 6 (six) hours as needed. Shortness of breath and wheezing 06/21/12   Historical Provider, MD  ANORO ELLIPTA 62.5-25 MCG/INH AEPB Inhale 1 puff into the lungs daily. 12/16/14   Historical Provider, MD  aspirin EC 81 MG tablet Take 81 mg by mouth daily.    Historical Provider, MD  atenolol (TENORMIN) 50 MG tablet Take 50 mg by mouth 2 (two)  times daily.  05/01/12   Historical Provider, MD  BRILINTA 90 MG TABS tablet Take 90 mg by mouth 2 (two) times daily. 01/11/15   Historical Provider, MD  clonazePAM (KLONOPIN) 0.5 MG tablet Take 0.5 mg by mouth 3 (three) times daily. 01/09/15   Historical Provider, MD  cyclobenzaprine (FLEXERIL) 10 MG tablet Take 1 tablet (10 mg total) by mouth 2 (two) times daily as needed for muscle spasms. 10/15/15   Daryl F de Villier II, PA  diclofenac (VOLTAREN) 50 MG EC tablet Take 1 tablet (50 mg total) by mouth 2 (two) times daily. 11/07/15   Elson AreasLeslie K Sofia, PA-C  finasteride (PROSCAR) 5 MG tablet Take 5 mg by mouth daily. 12/26/14   Historical Provider, MD  folic acid (FOLVITE) 1 MG tablet Take 1 mg by mouth daily. 12/26/14   Historical Provider, MD  hydrochlorothiazide (HYDRODIURIL) 25 MG tablet Take 25 mg by mouth daily. 05/01/12   Historical Provider, MD  HYDROcodone-acetaminophen (NORCO/VICODIN) 5-325 MG tablet Take one tab po q 4-6 hrs prn pain 11/03/15   Elson AreasLeslie K Sofia, PA-C  ibuprofen (ADVIL,MOTRIN) 800 MG tablet Take 1 tablet (800 mg total) by mouth 3 (three) times daily. 11/03/15   Lonia SkinnerLeslie K Sofia, PA-C  LYRICA 100 MG capsule Take 100-300 mg by mouth 2 (two) times daily. 1 capsule in the morning and 3 capsules at  bedtime 01/09/15   Historical Provider, MD  mirtazapine (REMERON) 30 MG tablet Take 30 mg by mouth at bedtime. 12/26/14   Historical Provider, MD  naproxen (NAPROSYN) 500 MG tablet Take 1 tablet (500 mg total) by mouth 2 (two) times daily with a meal. 02/05/15   Standley Brooking, MD  Omeprazole 20 MG TBEC Take 20 mg by mouth daily. 12/23/14   Historical Provider, MD  oxybutynin (DITROPAN XL) 15 MG 24 hr tablet Take 15 mg by mouth at bedtime.    Historical Provider, MD  pravastatin (PRAVACHOL) 40 MG tablet Take 40 mg by mouth at bedtime. 12/26/14   Historical Provider, MD  risperiDONE (RISPERDAL) 2 MG tablet Take 2 mg by mouth at bedtime. 05/01/12   Historical Provider, MD  sertraline (ZOLOFT)  100 MG tablet Take 100 mg by mouth daily. 12/29/14   Historical Provider, MD  SPIRIVA HANDIHALER 18 MCG inhalation capsule Place 1 puff into inhaler and inhale daily. 11/20/14   Historical Provider, MD  tamsulosin (FLOMAX) 0.4 MG CAPS capsule Take 0.4 mg by mouth every evening. 12/19/14   Historical Provider, MD  traZODone (DESYREL) 100 MG tablet Take 50 mg by mouth at bedtime.  01/11/15   Historical Provider, MD  vitamin B-12 (CYANOCOBALAMIN) 100 MCG tablet Take 100 mcg by mouth daily.    Historical Provider, MD    Family History History reviewed. No pertinent family history.  Social History Social History  Substance Use Topics  . Smoking status: Current Every Day Smoker    Packs/day: 0.25  . Smokeless tobacco: Former Neurosurgeon  . Alcohol use No     Allergies   Penicillins   Review of Systems Review of Systems  All other systems reviewed and are negative.    Physical Exam Updated Vital Signs BP 119/72 (BP Location: Right Arm)   Pulse 70   Resp 16   Ht 5\' 8"  (1.727 m)   Wt 83.5 kg   SpO2 96%   BMI 27.98 kg/m   Physical Exam  Constitutional: He is oriented to person, place, and time. He appears well-developed and well-nourished.  HENT:  Head: Normocephalic.  Eyes: EOM are normal.  Neck: Normal range of motion.  Pulmonary/Chest: Effort normal.  Abdominal: He exhibits no distension.  Musculoskeletal: He exhibits tenderness.  Left shoulder in sling.  Pain to move  Neurological: He is alert and oriented to person, place, and time.  Skin: Skin is warm.  Psychiatric: He has a normal mood and affect.  Nursing note and vitals reviewed.    ED Treatments / Results  Labs (all labs ordered are listed, but only abnormal results are displayed) Labs Reviewed - No data to display  EKG  EKG Interpretation None       Radiology No results found.  Procedures Procedures (including critical care time)  Medications Ordered in ED Medications - No data to  display   Initial Impression / Assessment and Plan / ED Course  I have reviewed the triage vital signs and the nursing notes.  Pertinent labs & imaging results that were available during my care of the patient were reviewed by me and considered in my medical decision making (see chart for details).  Clinical Course    Pt advised by me previously that we could not continue narcotic rx.   Pt given Rx for voltaren.   Pt advised his primary Md needs to address his pain. Final Clinical Impressions(s) / ED Diagnoses   Final diagnoses:  Other chronic pain  New Prescriptions New Prescriptions   DICLOFENAC (VOLTAREN) 50 MG EC TABLET    Take 1 tablet (50 mg total) by mouth 2 (two) times daily.     Lonia Skinner [place], PA-C 11/07/15 1643    Canary Brim Tegeler, MD 11/08/15 [phone]

## 2015-11-07 NOTE — Discharge Instructions (Signed)
See your Physician for pain management

## 2015-11-10 ENCOUNTER — Inpatient Hospital Stay (HOSPITAL_COMMUNITY)
Admission: EM | Admit: 2015-11-10 | Discharge: 2015-11-13 | DRG: 603 | Disposition: A | Discharge status: Home / Self Care | Payer: Medicaid - Out of State | Attending: Family Medicine | Admitting: Family Medicine

## 2015-11-10 ENCOUNTER — Encounter (HOSPITAL_COMMUNITY): Payer: Self-pay | Admitting: Emergency Medicine

## 2015-11-10 ENCOUNTER — Emergency Department (HOSPITAL_COMMUNITY): Payer: Medicaid - Out of State

## 2015-11-10 DIAGNOSIS — E861 Hypovolemia: Secondary | ICD-10-CM | POA: Diagnosis present

## 2015-11-10 DIAGNOSIS — L03317 Cellulitis of buttock: Secondary | ICD-10-CM | POA: Diagnosis present

## 2015-11-10 DIAGNOSIS — Z79899 Other long term (current) drug therapy: Secondary | ICD-10-CM | POA: Diagnosis not present

## 2015-11-10 DIAGNOSIS — J449 Chronic obstructive pulmonary disease, unspecified: Secondary | ICD-10-CM | POA: Diagnosis present

## 2015-11-10 DIAGNOSIS — E876 Hypokalemia: Secondary | ICD-10-CM | POA: Diagnosis present

## 2015-11-10 DIAGNOSIS — F17210 Nicotine dependence, cigarettes, uncomplicated: Secondary | ICD-10-CM | POA: Diagnosis present

## 2015-11-10 DIAGNOSIS — Z7982 Long term (current) use of aspirin: Secondary | ICD-10-CM | POA: Diagnosis not present

## 2015-11-10 DIAGNOSIS — R739 Hyperglycemia, unspecified: Secondary | ICD-10-CM | POA: Diagnosis present

## 2015-11-10 DIAGNOSIS — B1920 Unspecified viral hepatitis C without hepatic coma: Secondary | ICD-10-CM | POA: Diagnosis present

## 2015-11-10 DIAGNOSIS — I10 Essential (primary) hypertension: Secondary | ICD-10-CM | POA: Diagnosis present

## 2015-11-10 DIAGNOSIS — L0390 Cellulitis, unspecified: Secondary | ICD-10-CM | POA: Diagnosis present

## 2015-11-10 DIAGNOSIS — Z7901 Long term (current) use of anticoagulants: Secondary | ICD-10-CM

## 2015-11-10 DIAGNOSIS — I2510 Atherosclerotic heart disease of native coronary artery without angina pectoris: Secondary | ICD-10-CM | POA: Diagnosis present

## 2015-11-10 DIAGNOSIS — Z955 Presence of coronary angioplasty implant and graft: Secondary | ICD-10-CM | POA: Diagnosis not present

## 2015-11-10 DIAGNOSIS — N179 Acute kidney failure, unspecified: Secondary | ICD-10-CM | POA: Diagnosis present

## 2015-11-10 DIAGNOSIS — L0232 Furuncle of buttock: Secondary | ICD-10-CM | POA: Diagnosis present

## 2015-11-10 DIAGNOSIS — F319 Bipolar disorder, unspecified: Secondary | ICD-10-CM | POA: Diagnosis present

## 2015-11-10 DIAGNOSIS — N289 Disorder of kidney and ureter, unspecified: Secondary | ICD-10-CM

## 2015-11-10 DIAGNOSIS — Z880 Allergy status to penicillin: Secondary | ICD-10-CM | POA: Diagnosis not present

## 2015-11-10 HISTORY — DX: Essential (primary) hypertension: I10

## 2015-11-10 HISTORY — DX: Atherosclerotic heart disease of native coronary artery without angina pectoris: I25.10

## 2015-11-10 LAB — COMPREHENSIVE METABOLIC PANEL
ALBUMIN: 3.7 g/dL (ref 3.5–5.0)
ALT: 17 U/L (ref 17–63)
AST: 17 U/L (ref 15–41)
Alkaline Phosphatase: 64 U/L (ref 38–126)
Anion gap: 7 (ref 5–15)
BILIRUBIN TOTAL: 0.8 mg/dL (ref 0.3–1.2)
BUN: 20 mg/dL (ref 6–20)
CO2: 28 mmol/L (ref 22–32)
CREATININE: 1.49 mg/dL — AB (ref 0.61–1.24)
Calcium: 9 mg/dL (ref 8.9–10.3)
Chloride: 100 mmol/L — ABNORMAL LOW (ref 101–111)
GFR calc Af Amer: 57 mL/min — ABNORMAL LOW (ref >60)
GFR, EST NON AFRICAN AMERICAN: 49 mL/min — AB (ref >60)
GLUCOSE: 107 mg/dL — AB (ref 65–99)
Potassium: 3.3 mmol/L — ABNORMAL LOW (ref 3.5–5.1)
Sodium: 135 mmol/L (ref 135–145)
TOTAL PROTEIN: 8.3 g/dL — AB (ref 6.5–8.1)

## 2015-11-10 LAB — I-STAT CHEM 8, ED
BUN: 28 mg/dL — AB (ref 6–20)
CALCIUM ION: 1.18 mmol/L (ref 1.15–1.40)
CHLORIDE: 93 mmol/L — AB (ref 101–111)
Creatinine, Ser: 1.5 mg/dL — ABNORMAL HIGH (ref 0.61–1.24)
GLUCOSE: 102 mg/dL — AB (ref 65–99)
HCT: 41 % (ref 39.0–52.0)
Hemoglobin: 13.9 g/dL (ref 13.0–17.0)
Potassium: 3.2 mmol/L — ABNORMAL LOW (ref 3.5–5.1)
SODIUM: 137 mmol/L (ref 135–145)
TCO2: 27 mmol/L (ref 0–100)

## 2015-11-10 LAB — CBC WITH DIFFERENTIAL/PLATELET
BASOS ABS: 0 10*3/uL (ref 0.0–0.1)
BASOS PCT: 0 %
Eosinophils Absolute: 0 10*3/uL (ref 0.0–0.7)
Eosinophils Relative: 0 %
HEMATOCRIT: 40.3 % (ref 39.0–52.0)
HEMOGLOBIN: 13.2 g/dL (ref 13.0–17.0)
LYMPHS PCT: 16 %
Lymphs Abs: 1.7 10*3/uL (ref 0.7–4.0)
MCH: 30.4 pg (ref 26.0–34.0)
MCHC: 32.8 g/dL (ref 30.0–36.0)
MCV: 92.9 fL (ref 78.0–100.0)
Monocytes Absolute: 1.1 10*3/uL — ABNORMAL HIGH (ref 0.1–1.0)
Monocytes Relative: 10 %
NEUTROS ABS: 7.7 10*3/uL (ref 1.7–7.7)
NEUTROS PCT: 74 %
Platelets: 176 10*3/uL (ref 150–400)
RBC: 4.34 MIL/uL (ref 4.22–5.81)
RDW: 15.9 % — ABNORMAL HIGH (ref 11.5–15.5)
WBC: 10.5 10*3/uL (ref 4.0–10.5)

## 2015-11-10 LAB — I-STAT CG4 LACTIC ACID, ED: LACTIC ACID, VENOUS: 1.88 mmol/L (ref 0.5–1.9)

## 2015-11-10 MED ORDER — FOLIC ACID 1 MG PO TABS
1.0000 mg | ORAL_TABLET | Freq: Every day | ORAL | Status: DC
  Administered 2015-11-11 – 2015-11-13 (×3): 1 mg via ORAL
  Filled 2015-11-10 (×3): qty 1

## 2015-11-10 MED ORDER — HYDROCHLOROTHIAZIDE 25 MG PO TABS
25.0000 mg | ORAL_TABLET | Freq: Every day | ORAL | Status: DC
  Administered 2015-11-11 – 2015-11-13 (×3): 25 mg via ORAL
  Filled 2015-11-10 (×3): qty 1

## 2015-11-10 MED ORDER — ATENOLOL 25 MG PO TABS
50.0000 mg | ORAL_TABLET | Freq: Two times a day (BID) | ORAL | Status: DC
  Filled 2015-11-10: qty 2

## 2015-11-10 MED ORDER — ALBUTEROL SULFATE (2.5 MG/3ML) 0.083% IN NEBU: 3.0000 mL | INHALATION_SOLUTION | Freq: Four times a day (QID) | RESPIRATORY_TRACT | Status: DC | PRN

## 2015-11-10 MED ORDER — ACETAMINOPHEN 650 MG RE SUPP: 650.0000 mg | Freq: Four times a day (QID) | RECTAL | Status: DC | PRN

## 2015-11-10 MED ORDER — OLANZAPINE 5 MG PO TABS
2.5000 mg | ORAL_TABLET | Freq: Every morning | ORAL | Status: DC
  Administered 2015-11-11: 5 mg via ORAL
  Administered 2015-11-12 – 2015-11-13 (×2): 2.5 mg via ORAL
  Filled 2015-11-10 (×3): qty 1

## 2015-11-10 MED ORDER — MIRTAZAPINE 30 MG PO TABS
30.0000 mg | ORAL_TABLET | Freq: Every day | ORAL | Status: DC
  Administered 2015-11-10 – 2015-11-12 (×3): 30 mg via ORAL
  Filled 2015-11-10 (×3): qty 1

## 2015-11-10 MED ORDER — ONDANSETRON HCL 4 MG/2ML IJ SOLN
4.0000 mg | Freq: Once | INTRAMUSCULAR | Status: AC
  Administered 2015-11-10: 4 mg via INTRAVENOUS
  Filled 2015-11-10: qty 2

## 2015-11-10 MED ORDER — SERTRALINE HCL 50 MG PO TABS
100.0000 mg | ORAL_TABLET | Freq: Every day | ORAL | Status: DC
  Administered 2015-11-11 – 2015-11-13 (×3): 100 mg via ORAL
  Filled 2015-11-10 (×3): qty 2

## 2015-11-10 MED ORDER — OXYBUTYNIN CHLORIDE ER 5 MG PO TB24
15.0000 mg | ORAL_TABLET | Freq: Every day | ORAL | Status: DC
  Administered 2015-11-10 – 2015-11-12 (×3): 15 mg via ORAL
  Filled 2015-11-10 (×3): qty 3

## 2015-11-10 MED ORDER — LISINOPRIL 10 MG PO TABS
20.0000 mg | ORAL_TABLET | Freq: Every day | ORAL | Status: DC
  Administered 2015-11-11 – 2015-11-13 (×3): 20 mg via ORAL
  Filled 2015-11-10 (×3): qty 2

## 2015-11-10 MED ORDER — HEPARIN SODIUM (PORCINE) 5000 UNIT/ML IJ SOLN
5000.0000 [IU] | Freq: Three times a day (TID) | INTRAMUSCULAR | Status: DC
  Administered 2015-11-10 – 2015-11-11 (×2): 5000 [IU] via SUBCUTANEOUS
  Filled 2015-11-10 (×2): qty 1

## 2015-11-10 MED ORDER — TAMSULOSIN HCL 0.4 MG PO CAPS
0.4000 mg | ORAL_CAPSULE | Freq: Every evening | ORAL | Status: DC
  Administered 2015-11-10 – 2015-11-12 (×3): 0.4 mg via ORAL
  Filled 2015-11-10 (×3): qty 1

## 2015-11-10 MED ORDER — TICAGRELOR 60 MG PO TABS
60.0000 mg | ORAL_TABLET | Freq: Two times a day (BID) | ORAL | Status: DC
  Administered 2015-11-11 – 2015-11-13 (×4): 60 mg via ORAL
  Filled 2015-11-10 (×10): qty 1

## 2015-11-10 MED ORDER — RISPERIDONE 1 MG PO TABS
2.0000 mg | ORAL_TABLET | Freq: Every day | ORAL | Status: DC
  Administered 2015-11-10: 2 mg via ORAL
  Filled 2015-11-10: qty 2

## 2015-11-10 MED ORDER — HYDROCODONE-ACETAMINOPHEN 5-325 MG PO TABS
1.0000 | ORAL_TABLET | ORAL | Status: DC | PRN
  Administered 2015-11-10 – 2015-11-13 (×13): 1 via ORAL
  Filled 2015-11-10 (×13): qty 1

## 2015-11-10 MED ORDER — OLANZAPINE 5 MG PO TABS
10.0000 mg | ORAL_TABLET | Freq: Every day | ORAL | Status: DC
  Administered 2015-11-10 – 2015-11-12 (×3): 10 mg via ORAL
  Filled 2015-11-10 (×2): qty 2

## 2015-11-10 MED ORDER — NICOTINE 21 MG/24HR TD PT24
21.0000 mg | MEDICATED_PATCH | Freq: Every day | TRANSDERMAL | Status: DC
  Administered 2015-11-10 – 2015-11-13 (×4): 21 mg via TRANSDERMAL
  Filled 2015-11-10 (×4): qty 1

## 2015-11-10 MED ORDER — PREGABALIN 50 MG PO CAPS
100.0000 mg | ORAL_CAPSULE | Freq: Every day | ORAL | Status: DC
  Administered 2015-11-11 – 2015-11-13 (×3): 100 mg via ORAL
  Filled 2015-11-10 (×3): qty 2

## 2015-11-10 MED ORDER — VITAMIN D (ERGOCALCIFEROL) 1.25 MG (50000 UNIT) PO CAPS
50000.0000 [IU] | ORAL_CAPSULE | ORAL | Status: DC
  Filled 2015-11-10: qty 1

## 2015-11-10 MED ORDER — FINASTERIDE 5 MG PO TABS
5.0000 mg | ORAL_TABLET | Freq: Every day | ORAL | Status: DC
  Administered 2015-11-11 – 2015-11-13 (×3): 5 mg via ORAL
  Filled 2015-11-10 (×5): qty 1

## 2015-11-10 MED ORDER — PREGABALIN 75 MG PO CAPS
300.0000 mg | ORAL_CAPSULE | Freq: Every day | ORAL | Status: DC
  Administered 2015-11-10 – 2015-11-12 (×3): 300 mg via ORAL
  Filled 2015-11-10 (×3): qty 4

## 2015-11-10 MED ORDER — LEVOFLOXACIN IN D5W 750 MG/150ML IV SOLN
750.0000 mg | INTRAVENOUS | Status: DC
  Administered 2015-11-10 – 2015-11-12 (×3): 750 mg via INTRAVENOUS
  Filled 2015-11-10 (×3): qty 150

## 2015-11-10 MED ORDER — ONDANSETRON HCL 4 MG/2ML IJ SOLN: 4.0000 mg | Freq: Three times a day (TID) | INTRAMUSCULAR | Status: DC | PRN

## 2015-11-10 MED ORDER — ASPIRIN EC 81 MG PO TBEC
81.0000 mg | DELAYED_RELEASE_TABLET | Freq: Every day | ORAL | Status: DC
  Administered 2015-11-11 – 2015-11-13 (×3): 81 mg via ORAL
  Filled 2015-11-10 (×3): qty 1

## 2015-11-10 MED ORDER — SODIUM CHLORIDE 0.9 % IV SOLN
INTRAVENOUS | Status: DC
  Administered 2015-11-10: 23:00:00 via INTRAVENOUS

## 2015-11-10 MED ORDER — PRAVASTATIN SODIUM 40 MG PO TABS
40.0000 mg | ORAL_TABLET | Freq: Every day | ORAL | Status: DC
  Administered 2015-11-10 – 2015-11-12 (×3): 40 mg via ORAL
  Filled 2015-11-10 (×3): qty 1

## 2015-11-10 MED ORDER — SODIUM CHLORIDE 0.9 % IV BOLUS (SEPSIS)
1000.0000 mL | Freq: Once | INTRAVENOUS | Status: AC
  Administered 2015-11-10: 1000 mL via INTRAVENOUS

## 2015-11-10 MED ORDER — CLONAZEPAM 0.5 MG PO TABS
0.5000 mg | ORAL_TABLET | Freq: Three times a day (TID) | ORAL | Status: DC
  Administered 2015-11-10 – 2015-11-13 (×8): 0.5 mg via ORAL
  Filled 2015-11-10 (×7): qty 1

## 2015-11-10 MED ORDER — TEMAZEPAM 15 MG PO CAPS
15.0000 mg | ORAL_CAPSULE | Freq: Every day | ORAL | Status: DC
  Administered 2015-11-10 – 2015-11-12 (×3): 15 mg via ORAL
  Filled 2015-11-10 (×3): qty 1

## 2015-11-10 MED ORDER — UMECLIDINIUM-VILANTEROL 62.5-25 MCG/INH IN AEPB
1.0000 | INHALATION_SPRAY | Freq: Every day | RESPIRATORY_TRACT | Status: DC
  Administered 2015-11-11 – 2015-11-13 (×3): 1 via RESPIRATORY_TRACT
  Filled 2015-11-10: qty 14

## 2015-11-10 MED ORDER — HYDROMORPHONE HCL 1 MG/ML IJ SOLN
1.0000 mg | Freq: Once | INTRAMUSCULAR | Status: AC
  Administered 2015-11-10: 1 mg via INTRAVENOUS
  Filled 2015-11-10: qty 1

## 2015-11-10 MED ORDER — PANTOPRAZOLE SODIUM 40 MG PO TBEC
40.0000 mg | DELAYED_RELEASE_TABLET | Freq: Every day | ORAL | Status: DC
  Administered 2015-11-11 – 2015-11-13 (×3): 40 mg via ORAL
  Filled 2015-11-10 (×3): qty 1

## 2015-11-10 MED ORDER — POTASSIUM CHLORIDE CRYS ER 20 MEQ PO TBCR
20.0000 meq | EXTENDED_RELEASE_TABLET | Freq: Once | ORAL | Status: AC
  Administered 2015-11-10: 20 meq via ORAL
  Filled 2015-11-10: qty 1

## 2015-11-10 MED ORDER — HYDROMORPHONE HCL 1 MG/ML IJ SOLN
1.0000 mg | INTRAMUSCULAR | Status: DC | PRN
  Administered 2015-11-10 (×2): 1 mg via INTRAVENOUS
  Filled 2015-11-10 (×3): qty 1

## 2015-11-10 MED ORDER — HYDROMORPHONE HCL 1 MG/ML IJ SOLN: 1.0000 mg | INTRAMUSCULAR | Status: DC | PRN

## 2015-11-10 MED ORDER — IOPAMIDOL (ISOVUE-300) INJECTION 61%
100.0000 mL | Freq: Once | INTRAVENOUS | Status: AC | PRN
  Administered 2015-11-10: 100 mL via INTRAVENOUS

## 2015-11-10 MED ORDER — ACETAMINOPHEN 325 MG PO TABS: 650.0000 mg | ORAL_TABLET | Freq: Four times a day (QID) | ORAL | Status: DC | PRN

## 2015-11-10 MED ORDER — VANCOMYCIN HCL IN DEXTROSE 1-5 GM/200ML-% IV SOLN
1000.0000 mg | Freq: Two times a day (BID) | INTRAVENOUS | Status: DC
  Administered 2015-11-10 – 2015-11-13 (×6): 1000 mg via INTRAVENOUS
  Filled 2015-11-10 (×6): qty 200

## 2015-11-10 MED ORDER — PREGABALIN 50 MG PO CAPS: 100.0000 mg | ORAL_CAPSULE | Freq: Two times a day (BID) | ORAL | Status: DC

## 2015-11-10 NOTE — ED Provider Notes (Signed)
[facility] Provider Note   CSN: [id number] Arrival date & time: 11/10/15  1550     History   Chief Complaint Chief Complaint  Patient presents with  . Abscess    HPI Casey Walker is a 60 y.o. male Patient with a PMH of Hep C and COPD  Who presents with a cc of Abscess. He states that it began as a boil on his left buttock. It has been worsening over the past 4 days and now extends through his perineum into his left groin. He has pain with defecation and has been running subjective fevers at home. He states that his roommate told him it might open on its own. He has had associated nausea.   HPI  Past Medical History:  Diagnosis Date  . 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 COPD (chronic obstructive pulmonary disease) (HCC)   . Hepatitis C   . Kidney disorder    due to hepatitis per patient    Patient Active Problem List   Diagnosis Date Noted  . AKI (acute kidney injury) (HCC) 02/05/2015  . Syncope 02/03/2015  . Major depression 02/03/2015  . 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 Kidney disorder   . HCV (hepatitis C virus) 11/08/2010  . Arteriosclerosis of coronary artery 10/31/2010  . Essential hypertension 10/31/2010  . Seizure (HCC) 10/31/2010  . Current tobacco use 10/31/2010    Past Surgical History:  Procedure Laterality Date  . CORONARY STENT PLACEMENT    . ELBOW SURGERY         Home Medications    Prior to Admission medications   Medication Sig Start Date End Date Taking? Authorizing Provider  albuterol (PROAIR HFA) 108 (90 Base) MCG/ACT inhaler Inhale 2 puffs into the lungs every 6 (six) hours as needed for wheezing or shortness of breath. Shortness of breath and wheezing 06/21/12  Yes Historical Provider, MD  ANORO ELLIPTA 62.5-25 MCG/INH AEPB Inhale 1 puff into the lungs daily. 12/16/14  Yes Historical Provider, MD  aspirin EC 81 MG tablet Take 81 mg by mouth daily.   Yes Historical Provider, MD  atenolol  (TENORMIN) 50 MG tablet Take 50 mg by mouth 2 (two) times daily.  05/01/12  Yes Historical Provider, MD  BRILINTA 60 MG TABS tablet Take 60 mg by mouth 2 (two) times daily. 10/17/15  Yes Historical Provider, MD  clonazePAM (KLONOPIN) 0.5 MG tablet Take 0.5 mg by mouth 3 (three) times daily. 01/09/15  Yes Historical Provider, MD  finasteride (PROSCAR) 5 MG tablet Take 5 mg by mouth daily. 12/26/14  Yes Historical Provider, MD  LYRICA 100 MG capsule Take 100-300 mg by mouth 2 (two) times daily. 1 capsule in the morning and 3 capsules at bedtime 01/09/15  Yes Historical Provider, MD  mirtazapine (REMERON) 30 MG tablet Take 30 mg by mouth at bedtime. 12/26/14  Yes Historical Provider, MD  nitroGLYCERIN (NITROSTAT) 0.4 MG SL tablet take one tablet by mouth as directed/as needed for chest pain. Take one tablet every 5 minutes. If no relief after 3 doses, call 911 10/13/15  Yes Historical Provider, MD  OLANZapine (ZYPREXA) 10 MG tablet Take 10 mg by mouth at bedtime. 10/02/15  Yes Historical Provider, MD  OLANZapine (ZYPREXA) 2.5 MG tablet Take 2.5 mg by mouth every morning. 10/02/15  Yes Historical Provider, MD  Omeprazole 20 MG TBEC Take 20 mg by mouth daily. 12/23/14  Yes Historical Provider, MD  oxybutynin (DITROPAN XL) 15 MG 24 hr tablet Take  15 mg by mouth at bedtime.   Yes Historical Provider, MD  pravastatin (PRAVACHOL) 40 MG tablet Take 40 mg by mouth at bedtime. 12/26/14  Yes Historical Provider, MD  risperiDONE (RISPERDAL) 2 MG tablet Take 2 mg by mouth at bedtime. 05/01/12  Yes Historical Provider, MD  sertraline (ZOLOFT) 100 MG tablet Take 100 mg by mouth daily. 12/29/14  Yes Historical Provider, MD  SPIRIVA HANDIHALER 18 MCG inhalation capsule Place 1 puff into inhaler and inhale daily. 11/20/14  Yes Historical Provider, MD  tamsulosin (FLOMAX) 0.4 MG CAPS capsule Take 0.4 mg by mouth every evening. 12/19/14  Yes Historical Provider, MD  traZODone (DESYREL) 100 MG tablet Take 50 mg by mouth at bedtime.   01/11/15  Yes Historical Provider, MD  vitamin B-12 (CYANOCOBALAMIN) 100 MCG tablet Take 100 mcg by mouth daily.   Yes Historical Provider, MD  Vitamin D, Ergocalciferol, (DRISDOL) 50000 units CAPS capsule Take 50,000 Units by mouth every 7 (seven) days.   Yes Historical Provider, MD  cyclobenzaprine (FLEXERIL) 10 MG tablet Take 1 tablet (10 mg total) by mouth 2 (two) times daily as needed for muscle spasms. 10/15/15   Daryl F de Villier II, PA  diclofenac (VOLTAREN) 50 MG EC tablet Take 1 tablet (50 mg total) by mouth 2 (two) times daily. 11/07/15   Elson Areas, PA-C  folic acid (FOLVITE) 1 MG tablet Take 1 mg by mouth daily. 12/26/14   Historical Provider, MD  hydrochlorothiazide (HYDRODIURIL) 25 MG tablet Take 25 mg by mouth daily. 05/01/12   Historical Provider, MD  HYDROcodone-acetaminophen (NORCO/VICODIN) 5-325 MG tablet Take one tab po q 4-6 hrs prn pain Patient taking differently: Take 1 tablet by mouth every 4 (four) hours as needed for moderate pain.  11/03/15   Elson Areas, PA-C  ibuprofen (ADVIL,MOTRIN) 800 MG tablet Take 1 tablet (800 mg total) by mouth 3 (three) times daily. 11/03/15   Elson Areas, PA-C  temazepam (RESTORIL) 15 MG capsule Take 15 mg by mouth at bedtime. 09/28/15   Historical Provider, MD    Family History No family history on file.  Social History Social History  Substance Use Topics  . Smoking status: Current Every Day Smoker    Packs/day: 0.25  . Smokeless tobacco: Former Neurosurgeon  . Alcohol use No     Allergies   Penicillins   Review of Systems Review of Systems   Ten systems reviewed and are negative for acute change, except as noted in the HPI.    Physical Exam Updated Vital Signs BP (!) 147/122 (BP Location: Left Arm)   Pulse 88   Temp 98.5 F (36.9 C) (Oral)   Resp 20   Ht 5\' 8"  (1.727 m)   Wt 83.5 kg   SpO2 100%   BMI 27.98 kg/m   Physical Exam  Constitutional: He appears well-developed and well-nourished. No distress.    Flushed appearance  HENT:  Head: Normocephalic and atraumatic.  Eyes: Conjunctivae are normal. No scleral icterus.  Neck: Normal range of motion. Neck supple.  Cardiovascular: Normal rate, regular rhythm and normal heart sounds.   Pulmonary/Chest: Effort normal and breath sounds normal. No respiratory distress.  Abdominal: Soft. There is no tenderness.  Musculoskeletal: He exhibits no edema.  Neurological: He is alert.  Skin: Skin is warm and dry. He is not diaphoretic.  Large Abscess to the left buttock with redness and induration extending. Through the perineum. Some erythema present on the scrotum. Exquisetly tttp  Psychiatric: His behavior is  normal.  Nursing note and vitals reviewed.    ED Treatments / Results  Labs (all labs ordered are listed, but only abnormal results are displayed) Labs Reviewed  CBC WITH DIFFERENTIAL/PLATELET - Abnormal; Notable for the following:       Result Value   RDW 15.9 (*)    Monocytes Absolute 1.1 (*)    All other components within normal limits  COMPREHENSIVE METABOLIC PANEL - Abnormal; Notable for the following:    Potassium 3.3 (*)    Chloride 100 (*)    Glucose, Bld 107 (*)    Creatinine, Ser 1.49 (*)    Total Protein 8.3 (*)    GFR calc non Af Amer 49 (*)    GFR calc Af Amer 57 (*)    All other components within normal limits  I-STAT CHEM 8, ED - Abnormal; Notable for the following:    Potassium 3.2 (*)    Chloride 93 (*)    BUN 28 (*)    Creatinine, Ser 1.50 (*)    Glucose, Bld 102 (*)    All other components within normal limits  I-STAT CG4 LACTIC ACID, ED    EKG  EKG Interpretation None       Radiology No results found.  Procedures Procedures (including critical care time)  Medications Ordered in ED Medications  vancomycin (VANCOCIN) IVPB 1000 mg/200 mL premix (not administered)  sodium chloride 0.9 % bolus 1,000 mL (1,000 mLs Intravenous New Bag/Given 11/10/15 1656)  HYDROmorphone (DILAUDID) injection 1 mg (1  mg Intravenous Given 11/10/15 1655)  ondansetron (ZOFRAN) injection 4 mg (4 mg Intravenous Given 11/10/15 1655)     Initial Impression / Assessment and Plan / ED Course  I have reviewed the triage vital signs and the nursing notes.  Pertinent labs & imaging results that were available during my care of the patient were reviewed by me and considered in my medical decision making (see chart for details).  Clinical Course    Patient with  No localized abscess for draining.  Patient will need admission for cellulitis.   Patient admitted for cellulitis. I have discussed the case with Dr. Selena Batten who will admit Final Clinical Impressions(s) / ED Diagnoses   Final diagnoses:  Cellulitis of buttock    New Prescriptions New Prescriptions   No medications on file     Arthor Captain, PA-C 11/10/15 1932    Lavera Guise, MD 11/10/15 2354

## 2015-11-10 NOTE — ED Triage Notes (Signed)
Abscess to right buttocks x 2 days.

## 2015-11-10 NOTE — ED Notes (Signed)
Pt given pack of crackers & some water at this time.

## 2015-11-10 NOTE — H&P (Signed)
TRH H&P   Patient Demographics:    Casey Walker, is a 60 y.o. male  MRN: [id number]   DOB - [birth year]  Admit Date - 11/10/2015  Outpatient Primary MD for the patient is Andres Shad, MD  Referring MD/NP/PA:   Margarita Mail  Outpatient Specialists:   [facility] (neurology) , Pandio, (urologist), Alroy Dust (cardiologist)  Patient coming from: home  Chief Complaint  Patient presents with  . Abscess      HPI:    Casey Walker  is a 60 y.o. male, w hypertension, CAD, Copd (not on home o2), Hepatitis C, presents w complaint of redness and boil left buttock x 4 days.  It apparently is extending into the perineum into his left groin. Subjective fever at home.  Slight nausea, no emesis. ED requested admission for cellulitis/phlegmon.    Review of systems:    In addition to the HPI above,   No Headache, No changes with Vision or hearing, No problems swallowing food or Liquids, No Chest pain, Cough or Shortness of Breath, No Abdominal pain, No Nausea or Vommitting, Bowel movements are regular, No Blood in stool or Urine, No dysuria,  No new joints pains-aches,  No new weakness, tingling, numbness in any extremity, No recent weight gain or loss, No polyuria, polydypsia or polyphagia, No significant Mental Stressors.  A full 10 point Review of Systems was done, except as stated above, all other Review of Systems were negative.   With Past History of the following :    Past Medical History:  Diagnosis Date  . CAD (coronary artery disease)    [place], [place]  . 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 COPD (chronic obstructive pulmonary disease) ([place])   . Hepatitis C   . Hypertension   . Kidney disorder    due to hepatitis per patient      Past Surgical History:  Procedure Laterality Date  . CORONARY STENT PLACEMENT    . ELBOW SURGERY    .  TRANSURETHRAL RESECTION OF PROSTATE        Social History:     Social History  Substance Use Topics  . Smoking status: Current Every Day Smoker    Packs/day: 0.25  . Smokeless tobacco: Former Systems developer  . Alcohol use No     Lives - at home  Mobility - able to ambulate by self   Family History :     Family History  Problem Relation Age of Onset  . Hypertension Mother      Home Medications:   Prior to Admission medications   Medication Sig Start Date End Date Taking? Authorizing Provider  albuterol (PROAIR HFA) 108 (90 Base) MCG/ACT inhaler Inhale 2 puffs into the lungs every 6 (six) hours as needed for wheezing or shortness of breath. Shortness of breath and wheezing 06/21/12  Yes Historical Provider, MD  Jearl Klinefelter ELLIPTA 62.5-25 MCG/INH AEPB  Inhale 1 puff into the lungs daily. 12/16/14  Yes Historical Provider, MD  aspirin EC 81 MG tablet Take 81 mg by mouth daily.   Yes Historical Provider, MD  atenolol (TENORMIN) 50 MG tablet Take 50 mg by mouth 2 (two) times daily.  05/01/12  Yes Historical Provider, MD  BRILINTA 60 MG TABS tablet Take 60 mg by mouth 2 (two) times daily. 10/17/15  Yes Historical Provider, MD  clonazePAM (KLONOPIN) 0.5 MG tablet Take 0.5 mg by mouth 3 (three) times daily. 01/09/15  Yes Historical Provider, MD  diclofenac (VOLTAREN) 50 MG EC tablet Take 1 tablet (50 mg total) by mouth 2 (two) times daily. 11/07/15  Yes Hollace Kinnier Sofia, PA-C  finasteride (PROSCAR) 5 MG tablet Take 5 mg by mouth daily. 12/26/14  Yes Historical Provider, MD  folic acid (FOLVITE) 1 MG tablet Take 1 mg by mouth daily. 12/26/14  Yes Historical Provider, MD  hydrochlorothiazide (HYDRODIURIL) 25 MG tablet Take 25 mg by mouth daily. 05/01/12  Yes Historical Provider, MD  HYDROcodone-acetaminophen (NORCO/VICODIN) 5-325 MG tablet Take one tab po q 4-6 hrs prn pain Patient taking differently: Take 1 tablet by mouth every 4 (four) hours as needed for moderate pain.  11/03/15  Yes Hollace Kinnier Sofia, PA-C    lisinopril (PRINIVIL,ZESTRIL) 20 MG tablet Take 20 mg by mouth daily. 10/11/15  Yes Historical Provider, MD  LYRICA 100 MG capsule Take 100-300 mg by mouth 2 (two) times daily. 1 capsule in the morning and 3 capsules at bedtime 01/09/15  Yes Historical Provider, MD  mirtazapine (REMERON) 30 MG tablet Take 30 mg by mouth at bedtime. 12/26/14  Yes Historical Provider, MD  nitroGLYCERIN (NITROSTAT) 0.4 MG SL tablet take one tablet by mouth as directed/as needed for chest pain. Take one tablet every 5 minutes. If no relief after 3 doses, call 911 10/13/15  Yes Historical Provider, MD  OLANZapine (ZYPREXA) 10 MG tablet Take 10 mg by mouth at bedtime. 10/02/15  Yes Historical Provider, MD  OLANZapine (ZYPREXA) 2.5 MG tablet Take 2.5 mg by mouth every morning. 10/02/15  Yes Historical Provider, MD  Omeprazole 20 MG TBEC Take 20 mg by mouth daily. 12/23/14  Yes Historical Provider, MD  oxybutynin (DITROPAN XL) 15 MG 24 hr tablet Take 15 mg by mouth at bedtime.   Yes Historical Provider, MD  pravastatin (PRAVACHOL) 40 MG tablet Take 40 mg by mouth at bedtime. 12/26/14  Yes Historical Provider, MD  risperiDONE (RISPERDAL) 2 MG tablet Take 2 mg by mouth at bedtime. 05/01/12  Yes Historical Provider, MD  sertraline (ZOLOFT) 100 MG tablet Take 100 mg by mouth daily. 12/29/14  Yes Historical Provider, MD  SPIRIVA HANDIHALER 18 MCG inhalation capsule Place 1 puff into inhaler and inhale daily. 11/20/14  Yes Historical Provider, MD  tamsulosin (FLOMAX) 0.4 MG CAPS capsule Take 0.4 mg by mouth every evening. 12/19/14  Yes Historical Provider, MD  temazepam (RESTORIL) 15 MG capsule Take 15 mg by mouth at bedtime. 09/28/15  Yes Historical Provider, MD  traZODone (DESYREL) 100 MG tablet Take 50 mg by mouth at bedtime.  01/11/15  Yes Historical Provider, MD  vitamin B-12 (CYANOCOBALAMIN) 100 MCG tablet Take 100 mcg by mouth daily.   Yes Historical Provider, MD  Vitamin D, Ergocalciferol, (DRISDOL) 50000 units CAPS capsule Take  50,000 Units by mouth every 7 (seven) days.   Yes Historical Provider, MD  cyclobenzaprine (FLEXERIL) 10 MG tablet Take 1 tablet (10 mg total) by mouth 2 (two) times daily as needed  for muscle spasms. Patient not taking: Reported on 11/10/2015 10/15/15   Daryl F de Villier II, PA  ibuprofen (ADVIL,MOTRIN) 800 MG tablet Take 1 tablet (800 mg total) by mouth 3 (three) times daily. Patient not taking: Reported on 11/10/2015 11/03/15   Fransico Meadow, PA-C     Allergies:     Allergies  Allergen Reactions  . Penicillins Rash    Has patient had a PCN reaction causing immediate rash, facial/tongue/throat swelling, SOB or lightheadedness with hypotension: Yes Has patient had a PCN reaction causing severe rash involving mucus membranes or skin necrosis: No Has patient had a PCN reaction that required hospitalization No Has patient had a PCN reaction occurring within the last 10 years: No If all of the above answers are "NO", then may proceed with Cephalosporin use.      Physical Exam:   Vitals  Blood pressure 98/70, pulse 70, temperature 98.7 F (37.1 C), temperature source Oral, resp. rate 16, height 5' 8"  (1.727 m), weight 83.5 kg (184 lb), SpO2 96 %.   1. General  lying in bed in NAD,    2. Normal affect and insight, Not Suicidal or Homicidal, Awake Alert, Oriented X 3.  3. No F.N deficits, ALL C.Nerves Intact, Strength 5/5 all 4 extremities, Sensation intact all 4 extremities, Plantars down going.  4. Ears and Eyes appear Normal, Conjunctivae clear, PERRLA. Moist Oral Mucosa.  5. Supple Neck, No JVD, No cervical lymphadenopathy appriciated, No Carotid Bruits.  6. Symmetrical Chest wall movement, Good air movement bilaterally, CTAB.  7. RRR, No Gallops, Rubs or Murmurs, No Parasternal Heave.  8. Positive Bowel Sounds, Abdomen Soft, No tenderness, No organomegaly appriciated,No rebound -guarding or rigidity.  9.  No Cyanosis, Normal Skin Turgor, redness about 10 cm oval x 4 cm on  the left buttock with central 2cm area of fluctuance, the redness extends toward the perineum   10. Good muscle tone,  joints appear normal , no effusions, Normal ROM.    11. No Palpable Lymph Nodes in Neck or Axillae   Data Review:    CBC  Recent Labs Lab 11/10/15 1637 11/10/15 1708  WBC 10.5  --   HGB 13.2 13.9  HCT 40.3 41.0  PLT 176  --   MCV 92.9  --   MCH 30.4  --   MCHC 32.8  --   RDW 15.9*  --   LYMPHSABS 1.7  --   MONOABS 1.1*  --   EOSABS 0.0  --   BASOSABS 0.0  --    ------------------------------------------------------------------------------------------------------------------  Chemistries   Recent Labs Lab 11/10/15 1637 11/10/15 1708  NA 135 137  K 3.3* 3.2*  CL 100* 93*  CO2 28  --   GLUCOSE 107* 102*  BUN 20 28*  CREATININE 1.49* 1.50*  CALCIUM 9.0  --   AST 17  --   ALT 17  --   ALKPHOS 64  --   BILITOT 0.8  --    ------------------------------------------------------------------------------------------------------------------ estimated creatinine clearance is 54.4 mL/min (by C-G formula based on SCr of 1.5 mg/dL (H)). ------------------------------------------------------------------------------------------------------------------ No results for input(s): TSH, T4TOTAL, T3FREE, THYROIDAB in the last 72 hours.  Invalid input(s): FREET3  Coagulation profile No results for input(s): INR, PROTIME in the last 168 hours. ------------------------------------------------------------------------------------------------------------------- No results for input(s): DDIMER in the last 72 hours. -------------------------------------------------------------------------------------------------------------------  Cardiac Enzymes No results for input(s): CKMB, TROPONINI, MYOGLOBIN in the last 168 hours.  Invalid input(s): CK ------------------------------------------------------------------------------------------------------------------ No results  found for: BNP   ---------------------------------------------------------------------------------------------------------------  Urinalysis    Component Value Date/Time   COLORURINE YELLOW 02/03/2015 1623   APPEARANCEUR CLEAR 02/03/2015 1623   LABSPEC 1.015 02/03/2015 1623   PHURINE 5.5 02/03/2015 1623   GLUCOSEU NEGATIVE 02/03/2015 1623   HGBUR NEGATIVE 02/03/2015 1623   BILIRUBINUR NEGATIVE 02/03/2015 1623   KETONESUR NEGATIVE 02/03/2015 1623   PROTEINUR NEGATIVE 02/03/2015 1623   NITRITE NEGATIVE 02/03/2015 1623   LEUKOCYTESUR NEGATIVE 02/03/2015 1623    ----------------------------------------------------------------------------------------------------------------   Imaging Results:    Ct Pelvis W Contrast  Result Date: 11/10/2015 CLINICAL DATA:  Boil  on the left buttock for 3 days with fever EXAM: CT PELVIS WITH CONTRAST TECHNIQUE: Multidetector CT imaging of the pelvis was performed using the standard protocol following the bolus administration of intravenous contrast. CONTRAST:  133m ISOVUE-300 IOPAMIDOL (ISOVUE-300) INJECTION 61% COMPARISON:  None. FINDINGS: Urinary Tract: Urinary bladder is dilated. Bladder otherwise unremarkable. No distal ureteral dilatation. Bowel: Partially visualized appendix within normal limits. Multiple sigmoid colon diverticula without focal wall thickening. Vascular/Lymphatic: Atherosclerotic calcifications in the aorta. No aneurysm. There are several enlarged left pelvic sidewall lymph nodes, measuring up to 8 mm in short axis. Enlarged left external iliac lymph node measuring 1.1 cm. Reproductive:  No mass or other significant abnormality Other:  No free fluid. Musculoskeletal: There is focal skin thickening of the left gluteal region. Moderate edema and soft tissue stranding is present. There is a more focal soft tissue density measuring 3.3 by 1.9 cm within the subcutaneous fat of the left parasagittal gluteal soft tissues which may represent a  phlegmon. There is no well-formed abscess or rim enhancing fluid collection at this time. IMPRESSION: 1. Left gluteal region soft tissue thickening with moderate edema and subcutaneous soft tissue stranding. Focal 3.3 cm soft tissue density within the subcutaneous of the medial left gluteal tissues suspicious for a phlegmon. There is no well-formed abscess or rim enhancing fluid collection at this time. 2. 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 3. Enlarged left pelvic sidewall and iliac nodes, likely reactive. Electronically Signed   By: KDonavan FoilM.D.   On: 11/10/2015 19:09      Assessment & Plan:    Principal Problem:   Cellulitis Active Problems:   Essential hypertension   Hypokalemia   Hyperglycemia   Renal insufficiency    1.  Cellulitis vanco iv pharmacy to dose, levaquin iv pharmacy to dose Blood culture x2 ESR  2. Hypokalemia Replete Check cmp in am  3. Renal insufficiency Hydrate with ns iv  4. Hyperglycemia Check hga1c  5. Hepatitis C Outpatient follow up  6. Bipolar disorder Cont zyprexa, cont risperdal  DVT Prophylaxis lovenox, SCDs  AM Labs Ordered, also please review Full Orders  Family Communication: Admission, patients condition and plan of care including tests being ordered have been discussed with the patient who indicate understanding and agree with the plan and Code Status.  Code Status FULL CODE  Likely DC to  home  Condition GUARDED    Consults called: none  Admission status: inpatient  Time spent in minutes : 45 minutes   JJani GravelM.D on 11/10/2015 at 7:35 PM  Between 7am to 7pm - Pager - [phone] 7pm go to [email or web address] - password [email or web address] - Office  [phone]

## 2015-11-10 NOTE — ED Provider Notes (Deleted)
MSE was initiated and I personally evaluated the patient and placed orders (if any) at  4:07 PM on November 10, 2015.  The patient appears stable so that the remainder of the MSE may be completed by another provider.  Subjective: Patient with a PMH of Hep C and COPD  Who presents with a cc of Abscess. He states that it began as a boil on his left buttock. It has been worsening over the past 4 days and now extends through his perineum into his left groin. He has pain with defecation and has been running subjective fevers at home. He states that his roommate told him it might open on its own. He has had associated nausea.  Objective: Flushed appearance.  Large Abscess to the left buttock with redness and induration extending. Through the perineum. Some erythema present on the scrotum. Exquisetly tttp.  A/p: large buttock and perineal abscess. Feel that fourneirs must be ruled out. I have placed orders and imaging.      Arthor CaptainAbigail Nyjae Hodge, PA-C 11/10/15 1625

## 2015-11-10 NOTE — Progress Notes (Signed)
Pharmacy Antibiotic Note  Casey Walker is a 60 y.o. male admitted on 11/10/2015 with cellulitis.  Pharmacy has been consulted for VANCOMYCIN dosing.  Plan: Vancomycin 1000 IV every 12 hours.  Goal trough 10-15 mcg/mL.  Monitor labs, renal fxn, progress, c/s  Height: 5\' 8"  (172.7 cm) Weight: 184 lb (83.5 kg) IBW/kg (Calculated) : 68.4  Temp (24hrs), Avg:98.5 F (36.9 C), Min:98.5 F (36.9 C), Max:98.5 F (36.9 C)   Recent Labs Lab 11/10/15 1637  WBC 10.5    CrCl cannot be calculated (Patient's most recent lab result is older than the maximum 21 days allowed.).    Allergies  Allergen Reactions  . Penicillins Rash    Has patient had a PCN reaction causing immediate rash, facial/tongue/throat swelling, SOB or lightheadedness with hypotension: Yes Has patient had a PCN reaction causing severe rash involving mucus membranes or skin necrosis: No Has patient had a PCN reaction that required hospitalization No Has patient had a PCN reaction occurring within the last 10 years: No If all of the above answers are "NO", then may proceed with Cephalosporin use.    Antimicrobials this admission: Vancomycin 11/10/2015  >>   Dose adjustments this admission:  No results found for this or any previous visit (from the past 240 hour(s)).  Thank you for allowing pharmacy to be a part of this patient's care.  Casey Walker, Casey Walker A 11/10/2015 5:10 PM

## 2015-11-11 LAB — CBC WITH DIFFERENTIAL/PLATELET
Basophils Absolute: 0 10*3/uL (ref 0.0–0.1)
Basophils Relative: 0 %
Eosinophils Absolute: 0.1 10*3/uL (ref 0.0–0.7)
Eosinophils Relative: 1 %
HEMATOCRIT: 37.2 % — AB (ref 39.0–52.0)
HEMOGLOBIN: 12.1 g/dL — AB (ref 13.0–17.0)
LYMPHS ABS: 1.6 10*3/uL (ref 0.7–4.0)
LYMPHS PCT: 19 %
MCH: 30.5 pg (ref 26.0–34.0)
MCHC: 32.5 g/dL (ref 30.0–36.0)
MCV: 93.7 fL (ref 78.0–100.0)
MONOS PCT: 12 %
Monocytes Absolute: 0.9 10*3/uL (ref 0.1–1.0)
NEUTROS ABS: 5.7 10*3/uL (ref 1.7–7.7)
NEUTROS PCT: 68 %
Platelets: 155 10*3/uL (ref 150–400)
RBC: 3.97 MIL/uL — ABNORMAL LOW (ref 4.22–5.81)
RDW: 16.2 % — ABNORMAL HIGH (ref 11.5–15.5)
WBC: 8.4 10*3/uL (ref 4.0–10.5)

## 2015-11-11 LAB — COMPREHENSIVE METABOLIC PANEL
ALBUMIN: 3.3 g/dL — AB (ref 3.5–5.0)
ALT: 17 U/L (ref 17–63)
ANION GAP: 9 (ref 5–15)
AST: 22 U/L (ref 15–41)
Alkaline Phosphatase: 65 U/L (ref 38–126)
BUN: 17 mg/dL (ref 6–20)
CO2: 23 mmol/L (ref 22–32)
Calcium: 8.5 mg/dL — ABNORMAL LOW (ref 8.9–10.3)
Chloride: 105 mmol/L (ref 101–111)
Creatinine, Ser: 1.16 mg/dL (ref 0.61–1.24)
GFR calc non Af Amer: >60 mL/min (ref >60)
Glucose, Bld: 107 mg/dL — ABNORMAL HIGH (ref 65–99)
Potassium: 2.9 mmol/L — ABNORMAL LOW (ref 3.5–5.1)
SODIUM: 137 mmol/L (ref 135–145)
Total Bilirubin: 0.4 mg/dL (ref 0.3–1.2)
Total Protein: 7.4 g/dL (ref 6.5–8.1)

## 2015-11-11 MED ORDER — SODIUM CHLORIDE 0.9 % IV SOLN
INTRAVENOUS | Status: DC
Start: 2015-11-11 — End: 2015-11-13
  Administered 2015-11-11 – 2015-11-13 (×3): via INTRAVENOUS

## 2015-11-11 MED ORDER — VITAMIN D (ERGOCALCIFEROL) 1.25 MG (50000 UNIT) PO CAPS: 50000.0000 [IU] | ORAL_CAPSULE | ORAL | Status: DC

## 2015-11-11 MED ORDER — TICAGRELOR 90 MG PO TABS
ORAL_TABLET | ORAL | Status: AC
  Filled 2015-11-11: qty 1

## 2015-11-11 MED ORDER — TICAGRELOR 90 MG PO TABS
45.0000 mg | ORAL_TABLET | Freq: Once | ORAL | Status: AC
  Administered 2015-11-11: 45 mg via ORAL
  Filled 2015-11-11: qty 1

## 2015-11-11 MED ORDER — ATENOLOL 25 MG PO TABS
50.0000 mg | ORAL_TABLET | Freq: Every day | ORAL | Status: DC
  Administered 2015-11-11 – 2015-11-13 (×3): 50 mg via ORAL
  Filled 2015-11-11 (×3): qty 2

## 2015-11-11 MED ORDER — POTASSIUM CHLORIDE CRYS ER 20 MEQ PO TBCR
40.0000 meq | EXTENDED_RELEASE_TABLET | ORAL | Status: AC
  Administered 2015-11-11 (×2): 40 meq via ORAL
  Filled 2015-11-11 (×2): qty 2

## 2015-11-11 NOTE — Progress Notes (Signed)
Pharmacy Antibiotic Note  Casey MarketBobby Walker is a 60 y.o. male admitted on 11/10/2015 with cellulitis.  Pharmacy has been consulted for VANCOMYCIN and LEVAQUIN dosing.  Plan: Vancomycin 1000 IV every 12 hours.  Goal trough 10-15 mcg/mL.  Levaquin 750mg  IV q24hrs Monitor labs, renal fxn, progress, c/s  Height: 5\' 8"  (172.7 cm) Weight: 184 lb (83.5 kg) IBW/kg (Calculated) : 68.4  Temp (24hrs), Avg:98.2 F (36.8 C), Min:97.5 F (36.4 C), Max:98.7 F (37.1 C)   Recent Labs Lab 11/10/15 1637 11/10/15 1708 11/10/15 1709  WBC 10.5  --   --   CREATININE 1.49* 1.50*  --   LATICACIDVEN  --   --  1.88    Estimated Creatinine Clearance: 54.4 mL/min (by C-G formula based on SCr of 1.5 mg/dL (H)).    Allergies  Allergen Reactions  . Penicillins Rash    Has patient had a PCN reaction causing immediate rash, facial/tongue/throat swelling, SOB or lightheadedness with hypotension: Yes Has patient had a PCN reaction causing severe rash involving mucus membranes or skin necrosis: No Has patient had a PCN reaction that required hospitalization No Has patient had a PCN reaction occurring within the last 10 years: No If all of the above answers are "NO", then may proceed with Cephalosporin use.    Antimicrobials this admission: Vancomycin 10/27 >> Levaquin 10/27 >>  Dose adjustments this admission:  Recent Results (from the past 240 hour(s))  Culture, blood (routine x 2)     Status: None (Preliminary result)   Collection Time: 11/10/15  8:34 PM  Result Value Ref Range Status   Specimen Description BLOOD RIGHT HAND  Final   Special Requests BOTTLES DRAWN AEROBIC AND ANAEROBIC 8CC  Final   Culture PENDING  Incomplete   Report Status PENDING  Incomplete  Culture, blood (routine x 2)     Status: None (Preliminary result)   Collection Time: 11/10/15  8:59 PM  Result Value Ref Range Status   Specimen Description BLOOD LEFT HAND  Final   Special Requests BOTTLES DRAWN AEROBIC AND ANAEROBIC 8CC   Final   Culture PENDING  Incomplete   Report Status PENDING  Incomplete   Thank you for allowing pharmacy to be a part of this patient's care.  Valrie HartHall, Sanai Frick A 11/11/2015 8:58 AM

## 2015-11-11 NOTE — Consult Note (Signed)
SURGICAL CONSULTATION NOTE (initial) - cpt: [id number]  HISTORY OF PRESENT ILLNESS (HPI):  60 y.o. male presented to [facility] yesterday evening with Left buttock pain and redness extending to his Left groin x4 days, along with reports of subjective fever. Other than slight nausea without emesis, patient denies any CP or SOB. He reports he has not previously experienced similar. He also states is pain has improved noticeably since admission and initiation of IV levofloxacin and vancomycin. He also, however, reports pain in his Left groin that he says he experiences periodically, but no associated groin erythema.  Surgery is consulted by medical physician Dr. Irene Limbo in this context for evaluation and management of Left buttock abscess.  PAST MEDICAL HISTORY (PMH):  Past Medical History:  Diagnosis Date  . CAD (coronary artery disease)    [place], [place]  . 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 COPD (chronic obstructive pulmonary disease) (HCC)   . Hepatitis C   . Hypertension   . Kidney disorder    due to hepatitis per patient     PAST SURGICAL HISTORY [facility]):  Past Surgical History:  Procedure Laterality Date  . CORONARY STENT PLACEMENT    . ELBOW SURGERY    . TRANSURETHRAL RESECTION OF PROSTATE       MEDICATIONS:  Prior to Admission medications   Medication Sig Start Date End Date Taking? Authorizing Provider  albuterol (PROAIR HFA) 108 (90 Base) MCG/ACT inhaler Inhale 2 puffs into the lungs every 6 (six) hours as needed for wheezing or shortness of breath. Shortness of breath and wheezing 06/21/12  Yes Historical Provider, MD  ANORO ELLIPTA 62.5-25 MCG/INH AEPB Inhale 1 puff into the lungs daily. 12/16/14  Yes Historical Provider, MD  aspirin EC 81 MG tablet Take 81 mg by mouth daily.   Yes Historical Provider, MD  atenolol (TENORMIN) 50 MG tablet Take 50 mg by mouth 2 (two) times daily.  05/01/12  Yes Historical Provider, MD  BRILINTA 60 MG TABS tablet Take 60 mg by mouth 2  (two) times daily. 10/17/15  Yes Historical Provider, MD  clonazePAM (KLONOPIN) 0.5 MG tablet Take 0.5 mg by mouth 3 (three) times daily. 01/09/15  Yes Historical Provider, MD  diclofenac (VOLTAREN) 50 MG EC tablet Take 1 tablet (50 mg total) by mouth 2 (two) times daily. 11/07/15  Yes Lonia Skinner Sofia, PA-C  finasteride (PROSCAR) 5 MG tablet Take 5 mg by mouth daily. 12/26/14  Yes Historical Provider, MD  folic acid (FOLVITE) 1 MG tablet Take 1 mg by mouth daily. 12/26/14  Yes Historical Provider, MD  hydrochlorothiazide (HYDRODIURIL) 25 MG tablet Take 25 mg by mouth daily. 05/01/12  Yes Historical Provider, MD  HYDROcodone-acetaminophen (NORCO/VICODIN) 5-325 MG tablet Take one tab po q 4-6 hrs prn pain Patient taking differently: Take 1 tablet by mouth every 4 (four) hours as needed for moderate pain.  11/03/15  Yes Lonia Skinner Sofia, PA-C  lisinopril (PRINIVIL,ZESTRIL) 20 MG tablet Take 20 mg by mouth daily. 10/11/15  Yes Historical Provider, MD  LYRICA 100 MG capsule Take 100-300 mg by mouth 2 (two) times daily. 1 capsule in the morning and 3 capsules at bedtime 01/09/15  Yes Historical Provider, MD  mirtazapine (REMERON) 30 MG tablet Take 30 mg by mouth at bedtime. 12/26/14  Yes Historical Provider, MD  nitroGLYCERIN (NITROSTAT) 0.4 MG SL tablet take one tablet by mouth as directed/as needed for chest pain. Take one tablet every 5 minutes. If no relief after 3 doses, call 911 10/13/15  Yes Historical Provider, MD  OLANZapine (ZYPREXA) 10 MG tablet Take 10 mg by mouth at bedtime. 10/02/15  Yes Historical Provider, MD  OLANZapine (ZYPREXA) 2.5 MG tablet Take 2.5 mg by mouth every morning. 10/02/15  Yes Historical Provider, MD  Omeprazole 20 MG TBEC Take 20 mg by mouth daily. 12/23/14  Yes Historical Provider, MD  oxybutynin (DITROPAN XL) 15 MG 24 hr tablet Take 15 mg by mouth at bedtime.   Yes Historical Provider, MD  pravastatin (PRAVACHOL) 40 MG tablet Take 40 mg by mouth at bedtime. 12/26/14  Yes  Historical Provider, MD  risperiDONE (RISPERDAL) 2 MG tablet Take 2 mg by mouth at bedtime. 05/01/12  Yes Historical Provider, MD  sertraline (ZOLOFT) 100 MG tablet Take 100 mg by mouth daily. 12/29/14  Yes Historical Provider, MD  SPIRIVA HANDIHALER 18 MCG inhalation capsule Place 1 puff into inhaler and inhale daily. 11/20/14  Yes Historical Provider, MD  tamsulosin (FLOMAX) 0.4 MG CAPS capsule Take 0.4 mg by mouth every evening. 12/19/14  Yes Historical Provider, MD  temazepam (RESTORIL) 15 MG capsule Take 15 mg by mouth at bedtime. 09/28/15  Yes Historical Provider, MD  traZODone (DESYREL) 100 MG tablet Take 50 mg by mouth at bedtime.  01/11/15  Yes Historical Provider, MD  vitamin B-12 (CYANOCOBALAMIN) 100 MCG tablet Take 100 mcg by mouth daily.   Yes Historical Provider, MD  Vitamin D, Ergocalciferol, (DRISDOL) 50000 units CAPS capsule Take 50,000 Units by mouth every 7 (seven) days.   Yes Historical Provider, MD  cyclobenzaprine (FLEXERIL) 10 MG tablet Take 1 tablet (10 mg total) by mouth 2 (two) times daily as needed for muscle spasms. Patient not taking: Reported on 11/10/2015 10/15/15   Daryl F de Villier II, PA  ibuprofen (ADVIL,MOTRIN) 800 MG tablet Take 1 tablet (800 mg total) by mouth 3 (three) times daily. Patient not taking: Reported on 11/10/2015 11/03/15   Elson AreasLeslie K Sofia, PA-C     ALLERGIES:  Allergies  Allergen Reactions  . Penicillins Rash    Has patient had a PCN reaction causing immediate rash, facial/tongue/throat swelling, SOB or lightheadedness with hypotension: Yes Has patient had a PCN reaction causing severe rash involving mucus membranes or skin necrosis: No Has patient had a PCN reaction that required hospitalization No Has patient had a PCN reaction occurring within the last 10 years: No If all of the above answers are "NO", then may proceed with Cephalosporin use.      SOCIAL HISTORY:  Social History   Social History  . Marital status: Divorced    Spouse  name: N/A  . Number of children: N/A  . Years of education: N/A   Occupational History  . Not on file.   Social History Main Topics  . Smoking status: Current Every Day Smoker    Packs/day: 0.25  . Smokeless tobacco: Former NeurosurgeonUser  . Alcohol use No  . Drug use: No  . Sexual activity: Not on file   Other Topics Concern  . Not on file   Social History Narrative  . No narrative on file    The patient currently resides (home / rehab facility / nursing home): Home [place]  The patient normally is (ambulatory / bedbound): Ambulatory   FAMILY HISTORY:  Family History  Problem Relation Age of Onset  . Hypertension Mother      REVIEW OF SYSTEMS:  Constitutional: denies weight loss, fever, chills, or sweats  Eyes: denies any other vision  changes, history of eye injury  ENT: denies sore throat, hearing problems  Respiratory: denies shortness of breath, wheezing  Cardiovascular: denies chest pain, palpitations  Gastrointestinal: denies abdominal pain, N/V, or diarrhea except intermittent Left groin pain as per HPI Genitourinary: denies burning with urination or urinary frequency Musculoskeletal: denies any other joint pains or cramps  Skin: denies any other rashes or skin discolorations except Left buttock pain and erythema as per HPI Neurological: denies any other headache, dizziness, weakness  Psychiatric: denies any other depression, anxiety   All other review of systems were negative   VITAL SIGNS:  Temp:  [97.5 F (36.4 C)-98.7 F (37.1 C)] 97.5 F (36.4 C) (10/28 0426) Pulse Rate:  [64-88] 65 (10/28 0426) Resp:  [16-20] 16 (10/28 0100) BP: (80-147)/(48-122) 112/76 (10/28 0426) SpO2:  [95 %-100 %] 97 % (10/28 0946) Weight:  [83.5 kg (184 lb)] 83.5 kg (184 lb) (10/27 1554)     Height: 5\' 8"  (172.7 cm) Weight: 83.5 kg (184 lb) BMI (Calculated): 28   INTAKE/OUTPUT:  This shift: Total I/O In: 240 [P.O.:240] Out: -    Last 2 shifts: @IOLAST2SHIFTS @   PHYSICAL EXAM:  Constitutional:  -- Normal overweight body habitus  -- Awake, alert, and oriented x3  Eyes:  -- Pupils equally round and reactive to light  -- No scleral icterus  Ear, nose, and throat:  -- No jugular venous distension  Pulmonary:  -- No crackles  -- Equal breath sounds bilaterally -- Breathing non-labored at rest Cardiovascular:  -- S1, S2 present  -- No pericardial rubs Gastrointestinal:  -- Abdomen soft, nontender, nondistended, no guarding/rebound, tender easily reducible Left inguinal hernia with relief following reduction -- No abdominal masses appreciated, pulsatile or otherwise  Musculoskeletal and Integumentary:  -- Wounds or skin discoloration: Left buttock erythema and tender induration without fluctuance to suggest underlying abscess, minimal drainage -- Extremities: B/L UE and LE FROM, hands and feet warm, no edema  Neurologic:  -- Motor function: intact and symmetric -- Sensation: intact and symmetric   Labs:  CBC:  Lab Results  Component Value Date   WBC 8.4 11/11/2015   RBC 3.97 (L) 11/11/2015   BMP:  Lab Results  Component Value Date   GLUCOSE 107 (H) 11/11/2015   CO2 23 11/11/2015   BUN 17 11/11/2015   CREATININE 1.16 11/11/2015   CALCIUM 8.5 (L) 11/11/2015     Imaging studies:  CT Pelvis with IV Contrast (11/11/2015) - personally reviewed 1. Left gluteal region soft tissue thickening with moderate edema and subcutaneous soft tissue stranding. Focal 3.3 cm soft tissue density within the subcutaneous of the medial left gluteal tissues suspicious for a phlegmon. There is no well-formed abscess or rim enhancing fluid collection at this time. 2. 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 3. Enlarged left pelvic sidewall and iliac nodes, likely reactive.  Assessment/Plan: (ICD-10's: L03.317) 60 y.o. male with Left buttock induration/phlegmon with cellulitis, complicated by resolving  hypotension likely attributable to hypovolemia (in absence of leukocytosis or fever) and also resolving AKI (associated with infection as well as possibly contrast-induced nephropathy) and by pertinent comorbidities including HTN, CAD s/p PCI for MI, CKD, COPD (not on home O2), ongoing tobacco abuse, hepatitis C, GERD, and bipolar disorder.   - IV fluids, heart-healthy diet  - agree with antibiotics as per medical team   - no discrete abscess to drain at this time, but will reassess while patient remains inpatient  - can follow-up outpatient for symptomatic easily reducible Left inguinal hernia, but  will first need to quit smoking and will need pre-op medical evaluation, particularly cardiac and pulmonary, before considering elective surgery  - demonstrated to patient how to self-reduce his Left inguinal hernia and explained signs/symptoms for which to seek more urgent attention/care  - smoking cessation strongly advised and discussed, especially considering COPD and CAD  - DVT prophylaxis  All of the above findings and recommendations were discussed with the patient, and all of patient's questions were answered to his expressed satisfaction.  Thank you for the opportunity to participate in this patient's care.   -- Scherrie Gerlach Earlene Plater, MD, RPVI [place]: [facility] General Surgery [facility] Office: [phone]

## 2015-11-11 NOTE — Progress Notes (Signed)
PROGRESS NOTE  Casey MarketBobby Walker WUJ:[id number] DOB: 03-12-1955 DOA: 11/10/2015 PCP: Arlina RobesWINFIELD,[name], MD  Brief Narrative: 60 year-old male with a hx of HTN, CAD, COPD, hepatitis C, presents to the ED with complaints a boil and redness on his left buttock that had been present for 4 days. While in the ED, potassium was 3.2, BUN 28, Cr 1.50, and WBC wnl. CT pelvis showed evidence of Phlegmon. Will admit for further management of cellulitis.  Assessment/Plan: 1. Left buttock cellulitis with phlegmon per CT pelvis, no evidence of abscess at that time. Suspect developing abscess now. 2. AKI. Continue IV fluids. Recheck BMP 3. COPD. He is not currently on O2 at home. He appears to be at baseline.  4. Bipolar disorder. Noted   Hemodynamics are stable. He still has significant erythema, pain and I suspect he is developing an abscess that will require drainage.  Continue IV antibiotics. General surgery consultation.  Continue IV fluids, check BMP in the morning  Disposition Plan: Discharge once improved  Casey Sacksaniel Sumer Moorehouse, MD  [facility] Direct contact: [phone] --Via amion app OR  --[email or web address]; password [email or web address]  7PM-7AM contact night coverage as above 11/11/2015, 6:10 AM  LOS: 1 day   Consultants:  None  Procedures:  None  Antimicrobials:  Vancomycin 10/27 >>  Levaquin 10/27 >>  CC: Follow-up cellulitis  Interval history/Subjective: Continues to have significant pain left buttock radiating up towards the groin. Some spontaneous drainage. No pain in the scrotum or penis.  ROS:  He admits to sweating at night. He also admits to some nausea, but denies any vomiting.  Objective: Vitals:   11/10/15 2350 11/10/15 2351 11/11/15 0100 11/11/15 0426  BP: (!) 89/58 (!) 80/48 (!) 92/58 112/76  Pulse: 66 67 64 65  Resp:   16   Temp:    97.5 F (36.4 C)  TempSrc:      SpO2:  96%  96%  Weight:      Height:        Intake/Output Summary (Last 24 hours) at 11/11/15  0610 Last data filed at 11/10/15 2344  Gross per 24 hour  Intake             1350 ml  Output                0 ml  Net             1350 ml     Filed Weights   11/10/15 1554  Weight: 83.5 kg (184 lb)    Exam:    Constitutional:  . Appears calm and comfortable Respiratory:  . CTA bilaterally, no w/r/r.  . Respiratory effort normal. No retractions or accessory muscle use Cardiovascular:  . RRR, no m/r/g . No LE extremity edema   Skin:   There is significant erythemaAnd induration from the left buttocks extending toward the perineum. There is some drainage noted on chuck. There is no definite fluctuance but area is exquisitely tender to palpation.  Perineum appears unremarkable  Penis and scrotum appears unremarkable.   I have personally reviewed following labs and imaging studies:  No new labs Scheduled Meds: . aspirin EC  81 mg Oral Daily  . atenolol  50 mg Oral Daily  . clonazePAM  0.5 mg Oral TID  . finasteride  5 mg Oral Daily  . folic acid  1 mg Oral Daily  . heparin  5,000 Units Subcutaneous Q8H  . hydrochlorothiazide  25 mg Oral Daily  . levofloxacin (LEVAQUIN) IV  750 mg  Intravenous Q24H  . lisinopril  20 mg Oral Daily  . mirtazapine  30 mg Oral QHS  . nicotine  21 mg Transdermal Daily  . OLANZapine  10 mg Oral QHS  . OLANZapine  2.5 mg Oral q morning - 10a  . oxybutynin  15 mg Oral QHS  . pantoprazole  40 mg Oral Daily  . pravastatin  40 mg Oral QHS  . pregabalin  100 mg Oral Daily   And  . pregabalin  300 mg Oral QHS  . risperiDONE  2 mg Oral QHS  . sertraline  100 mg Oral Daily  . tamsulosin  0.4 mg Oral QPM  . temazepam  15 mg Oral QHS  . ticagrelor  60 mg Oral BID  . umeclidinium-vilanterol  1 puff Inhalation Daily  . vancomycin  1,000 mg Intravenous Q12H  . Vitamin D (Ergocalciferol)  50,000 Units Oral Q7 days   Continuous Infusions: . sodium chloride 75 mL/hr at 11/10/15 2235    Principal Problem:   Cellulitis Active Problems:    Essential hypertension   Hypokalemia   Hyperglycemia   Renal insufficiency   LOS: 1 day   Time spent 25 minutes  By signing my name below, I, Casey Walker, attest that this documentation has been prepared under the direction and in the presence of Casey Brunette P. Irene LimboGoodrich, MD. Electronically signed: Bobbie Stackhristopher Walker, Scribe.  11/11/15, 8:38am  I personally performed the services described in this documentation. All medical record entries made by the scribe were at my direction. I have reviewed the chart and agree that the record reflects my personal performance and is accurate and complete. Casey Sacksaniel Jake Fuhrmann, MD

## 2015-11-12 DIAGNOSIS — N179 Acute kidney failure, unspecified: Secondary | ICD-10-CM

## 2015-11-12 LAB — COMPREHENSIVE METABOLIC PANEL
ALBUMIN: 3 g/dL — AB (ref 3.5–5.0)
ALK PHOS: 64 U/L (ref 38–126)
ALT: 21 U/L (ref 17–63)
ANION GAP: 7 (ref 5–15)
AST: 24 U/L (ref 15–41)
BUN: 11 mg/dL (ref 6–20)
CHLORIDE: 108 mmol/L (ref 101–111)
CO2: 23 mmol/L (ref 22–32)
Calcium: 8.9 mg/dL (ref 8.9–10.3)
Creatinine, Ser: 0.88 mg/dL (ref 0.61–1.24)
GFR calc non Af Amer: >60 mL/min (ref >60)
GLUCOSE: 111 mg/dL — AB (ref 65–99)
Potassium: 3.7 mmol/L (ref 3.5–5.1)
SODIUM: 138 mmol/L (ref 135–145)
Total Bilirubin: 0.6 mg/dL (ref 0.3–1.2)
Total Protein: 7.2 g/dL (ref 6.5–8.1)

## 2015-11-12 LAB — HEMOGLOBIN A1C
Hgb A1c MFr Bld: 5.6 % (ref 4.8–5.6)
MEAN PLASMA GLUCOSE: 114 mg/dL

## 2015-11-12 NOTE — Progress Notes (Addendum)
PROGRESS NOTE  Nyra MarketBobby Housley RUE:[id number] DOB: [birth year] DOA: 11/10/2015 PCP: Arlina RobesWINFIELD,[name], MD  Brief Narrative: 60 year-old male with a hx of HTN, CAD, COPD, hepatitis C, presents to the ED with complaints a boil and redness on his left buttock that had been present for 4 days. While in the ED, potassium was 3.2, BUN 28, Cr 1.50, and WBC wnl. CT pelvis showed evidence of Phlegmon. Will admit for further management of cellulitis.  Assessment/Plan: 1. Left buttock cellulitis, no improvement on IV antibiotics. Hemodynamics are stable. Nontoxic. 2. AKI. Resolved with IV fluids. Likely secondary to acute illness. 3. COPD. does not use home oxygen. Appears stable. 4. Bipolar disorder. Stable. 5. Hepatitis C   No improvement, continues to have significant erythema, pain and drainage. Suspect developing abscess. Continue IV antibiotics. Further recommendations per general surgery.   DVT Prophylaxis: Lovenox Code status: Full Family communication: None Disposition Plan: Home  Brendia Sacksaniel Kamira Mellette, MD  [facility] Direct contact: [phone] --Via amion app OR  --[email or web address]; password [email or web address]  7PM-7AM contact night coverage as above 11/12/2015, 5:50 AM  LOS: 2 days   Consultants:  General Surgery  Procedures:  None  Antimicrobials:  Vancomycin 10/27 >>  Levaquin 10/27 >>  CC: Follow-up cellulitis  Interval history/Subjective: No significant change, continues to significant pain left buttock. Continues to have spontaneous drainage from the wound.   Objective: Vitals:   11/11/15 0946 11/11/15 1300 11/11/15 2232 11/12/15 0500  BP:  110/62 100/65   Pulse:  63 69   Resp:  18 17   Temp:  97.8 F (36.6 C) 99.1 F (37.3 C)   TempSrc:  Oral Oral   SpO2: 97% 95% 94%   Weight:    84.3 kg (185 lb 14.3 oz)  Height:        Intake/Output Summary (Last 24 hours) at 11/12/15 0550 Last data filed at 11/12/15 0501  Gross per 24 hour  Intake          3538.33 ml    Output                0 ml  Net          3538.33 ml     Filed Weights   11/10/15 1554 11/12/15 0500  Weight: 83.5 kg (184 lb) 84.3 kg (185 lb 14.3 oz)    Exam:    Constitutional:  . Appears calm. Mildly and comfortable. Respiratory:  . CTA bilaterally, no w/r/r.  . Respiratory effort normal.  Cardiovascular:  . RRR, no m/r/g . No LE extremity edema   Skin:  . Erythema over the buttock is without change. There is more erythema toward the perineum and spontaneous drainage from the buttock. More induration toward the perineum. Very tender to palpation.  Scrotum and penis are uninvolved   I have personally reviewed following labs and imaging studies:  BUN, creatinine within normal limits. Potassium 3.7. LFTs unremarkable.  Blood cultures pending, no growth to date.  Scheduled Meds: . aspirin EC  81 mg Oral Daily  . atenolol  50 mg Oral Daily  . clonazePAM  0.5 mg Oral TID  . finasteride  5 mg Oral Daily  . folic acid  1 mg Oral Daily  . hydrochlorothiazide  25 mg Oral Daily  . levofloxacin (LEVAQUIN) IV  750 mg Intravenous Q24H  . lisinopril  20 mg Oral Daily  . mirtazapine  30 mg Oral QHS  . nicotine  21 mg Transdermal Daily  . OLANZapine  10 mg Oral QHS  .  OLANZapine  2.5 mg Oral q morning - 10a  . oxybutynin  15 mg Oral QHS  . pantoprazole  40 mg Oral Daily  . pravastatin  40 mg Oral QHS  . pregabalin  100 mg Oral Daily   And  . pregabalin  300 mg Oral QHS  . sertraline  100 mg Oral Daily  . tamsulosin  0.4 mg Oral QPM  . temazepam  15 mg Oral QHS  . ticagrelor  60 mg Oral BID  . umeclidinium-vilanterol  1 puff Inhalation Daily  . vancomycin  1,000 mg Intravenous Q12H  . [START ON 11/26/2015] Vitamin D (Ergocalciferol)  50,000 Units Oral Q7 days   Continuous Infusions: . sodium chloride 125 mL/hr at 11/12/15 0458    Principal Problem:   Cellulitis Active Problems:   Essential hypertension   Hypokalemia   Hyperglycemia   Renal insufficiency    LOS: 2 days   Time spent 25 minutes  By signing my name below, I, Bobbie Stackhristopher Reid, attest that this documentation has been prepared under the direction and in the presence of Miliani Deike P. Irene LimboGoodrich, MD. Electronically signed: Bobbie Stackhristopher Reid, Scribe.  11/12/15, 9:00 AM

## 2015-11-12 NOTE — Progress Notes (Signed)
PT refused SCD's. Pt up ad lib with no difficulty and no risk of falls. Continue to monitor.

## 2015-11-13 LAB — BASIC METABOLIC PANEL
Anion gap: 7 (ref 5–15)
BUN: 10 mg/dL (ref 6–20)
CALCIUM: 8.7 mg/dL — AB (ref 8.9–10.3)
CO2: 23 mmol/L (ref 22–32)
CREATININE: 0.96 mg/dL (ref 0.61–1.24)
Chloride: 108 mmol/L (ref 101–111)
Glucose, Bld: 96 mg/dL (ref 65–99)
Potassium: 3.4 mmol/L — ABNORMAL LOW (ref 3.5–5.1)
SODIUM: 138 mmol/L (ref 135–145)

## 2015-11-13 MED ORDER — DOXYCYCLINE HYCLATE 100 MG PO TABS: 100.0000 mg | ORAL_TABLET | Freq: Two times a day (BID) | ORAL | Status: DC

## 2015-11-13 MED ORDER — POTASSIUM CHLORIDE CRYS ER 20 MEQ PO TBCR
40.0000 meq | EXTENDED_RELEASE_TABLET | Freq: Once | ORAL | Status: AC
  Administered 2015-11-13: 40 meq via ORAL
  Filled 2015-11-13: qty 2

## 2015-11-13 MED ORDER — DOXYCYCLINE HYCLATE 100 MG PO TABS: 100.0000 mg | ORAL_TABLET | Freq: Two times a day (BID) | ORAL | 0 refills | Status: DC

## 2015-11-13 NOTE — Progress Notes (Addendum)
PROGRESS NOTE  Casey MarketBobby Walker ZOX:[id number] DOB: [birth year] DOA: 11/10/2015 PCP: Arlina RobesWINFIELD,[name], MD  Brief Narrative: 60 year-old male with a hx of HTN, CAD, COPD, hepatitis C, presents to the ED with complaints a boil and redness on his left buttock that had been present for 4 days. While in the ED, potassium was 3.2, BUN 28, Cr 1.50, and WBC wnl. CT pelvis showed evidence of Phlegmon. Will admit for further management of cellulitis.  Assessment/Plan: 1. Left buttock cellulitis. Mixed response with improvement on the backside, still with significant induration and tenderness. Some spontaneous drainage. 2. AKI. Resolved with IV fluids.  3. COPD. remained stable. 4. Bipolar disorder. Remains stable. 5. Hepatitis C   Mixed response thus far. Afebrile. Still with induration and tenderness and spontaneous drainage. Further recommendations as per surgery.   ADDENDUM No indication for incision and drainage per surgery. Patient overall is feeling better and wants to go home. He has no fever or leukocytosis and has no evidence of complicating features. Plan for discharge home.   DVT Prophylaxis: Lovenox Code status: Full Family communication: None Disposition Plan: Home  Brendia Sacksaniel Goodrich, MD  [facility] Direct contact: [phone] --Via amion app OR  --[email or web address]; password [email or web address]  7PM-7AM contact night coverage as above 11/13/2015, 8:14 AM  LOS: 3 days   Consultants:  General Surgery  Procedures:  None  Antimicrobials:  Doxycycline 10/30 >> 11/3  Vancomycin 10/27 >> 10/30  Levaquin 10/27 >> 10/30  CC: Follow-up cellulitis  Interval history/Subjective: Feels about the same. He still has significant soreness around the buttock. Some spontaneous drainage.  Objective: Vitals:   11/12/15 2232 11/13/15 0500 11/13/15 0614 11/13/15 0738  BP: 132/82  126/77   Pulse: (!) 56  (!) 58   Resp: 16  16   Temp: 97.5 F (36.4 C)  98.2 F (36.8 C)   TempSrc:  Axillary  Oral   SpO2: 98%  97% 96%  Weight:  84.6 kg (186 lb 6.4 oz)    Height:        Intake/Output Summary (Last 24 hours) at 11/13/15 0814 Last data filed at 11/13/15 0513  Gross per 24 hour  Intake          3938.33 ml  Output                0 ml  Net          3938.33 ml     Filed Weights   11/10/15 1554 11/12/15 0500 11/13/15 0500  Weight: 83.5 kg (184 lb) 84.3 kg (185 lb 14.3 oz) 84.6 kg (186 lb 6.4 oz)    Exam:  Constitutional:   Appears calm, comfortable. Respiratory:   Clear to auscultation bilaterally. No wheezes, rales or rhonchi.  Normal respiratory effort. Cardiovascular:   Regular rate and rhythm. No murmur, rub or gallop. Skin:   Decreased erythema, edema and tenderness left buttock. Unchanged erythema and induration towards the perineum. Scrotum uninvolved. Psychiatric:   Mood and affect judgment and insight appear grossly normal   I have personally reviewed following labs and imaging studies:  K+ 3.4, BUN/creatinine normal  Scheduled Meds: . aspirin EC  81 mg Oral Daily  . atenolol  50 mg Oral Daily  . clonazePAM  0.5 mg Oral TID  . finasteride  5 mg Oral Daily  . folic acid  1 mg Oral Daily  . hydrochlorothiazide  25 mg Oral Daily  . levofloxacin (LEVAQUIN) IV  750 mg Intravenous Q24H  . lisinopril  20 mg Oral  Daily  . mirtazapine  30 mg Oral QHS  . nicotine  21 mg Transdermal Daily  . OLANZapine  10 mg Oral QHS  . OLANZapine  2.5 mg Oral q morning - 10a  . oxybutynin  15 mg Oral QHS  . pantoprazole  40 mg Oral Daily  . potassium chloride  40 mEq Oral Once  . pravastatin  40 mg Oral QHS  . pregabalin  100 mg Oral Daily   And  . pregabalin  300 mg Oral QHS  . sertraline  100 mg Oral Daily  . tamsulosin  0.4 mg Oral QPM  . temazepam  15 mg Oral QHS  . ticagrelor  60 mg Oral BID  . umeclidinium-vilanterol  1 puff Inhalation Daily  . vancomycin  1,000 mg Intravenous Q12H  . [START ON 11/26/2015] Vitamin D (Ergocalciferol)  50,000  Units Oral Q7 days   Continuous Infusions: . sodium chloride 125 mL/hr at 11/13/15 0414    Principal Problem:   Cellulitis Active Problems:   Essential hypertension   Hyperglycemia   LOS: 3 days

## 2015-11-13 NOTE — Progress Notes (Signed)
Patient states understanding of discharge. 

## 2015-11-13 NOTE — Discharge Summary (Signed)
Physician Discharge Summary  Casey MarketBobby Walker UJW:[id number] DOB: Feb 07, 1955 DOA: 11/10/2015  PCP: Casey RobesWINFIELD,[name], MD  Admit date: 11/10/2015 Discharge date: 11/13/2015  Recommendations for Outpatient Follow-up:  1. Resolution of left buttock cellulitis. 2. Polypharmacy. Consider minimizing drug regimen and discontinuing similar medications where able.  Follow-up Information    Casey RobesWINFIELD,[name], MD. Schedule an appointment as soon as possible for a visit in 1 week(s).   Specialty:  Family Medicine         Discharge Diagnoses:  1. Left buttock cellulitis  2. AKI  3. COPD  4. Hepatitis C  Discharge Condition: improved Disposition: home  Diet recommendation: regular  Filed Weights   11/10/15 1554 11/12/15 0500 11/13/15 0500  Weight: 83.5 kg (184 lb) 84.3 kg (185 lb 14.3 oz) 84.6 kg (186 lb 6.4 oz)    History of present illness:  60 year old man admitted for left buttock abscess with phlegmon but no evidence of abscess.  Hospital Course:  Treated with empiric antibiotics, seen by general surgery in consultation. Gradually improved with IV antibiotics, no evidence of abscess requiring drainage per surgery. General surgery has signed off. Hospitalization uncomplicated. Resolution expected with outpatient antibiotics.  1. Left buttock cellulitis. Mixed response with improvement on the backside, still with significant induration and tenderness. Some spontaneous drainage. 2. AKI. Resolved with IV fluids.  3. COPD. remained stable. 4. Bipolar disorder. Remains stable. 5. Hepatitis C  Consultants:  General Surgery  Procedures:  None  Antimicrobials:  Doxycycline 10/30 >> 11/3  Vancomycin 10/27 >> 10/30  Levaquin 10/27 >> 10/30  Discharge Instructions  Discharge Instructions    Activity as tolerated - No restrictions    Complete by:  As directed    Diet general    Complete by:  As directed    Discharge instructions    Complete by:  As directed    Call your physician or seek immediate medical attention for increased pain, redness, swelling, drainage, fever, dizziness, vomiting or worsening of condition.       Medication List    STOP taking these medications   cyclobenzaprine 10 MG tablet Commonly known as:  FLEXERIL   ibuprofen 800 MG tablet Commonly known as:  ADVIL,MOTRIN     TAKE these medications   ANORO ELLIPTA 62.5-25 MCG/INH Aepb Generic drug:  umeclidinium-vilanterol Inhale 1 puff into the lungs daily.   aspirin EC 81 MG tablet Take 81 mg by mouth daily.   atenolol 50 MG tablet Commonly known as:  TENORMIN Take 50 mg by mouth 2 (two) times daily.   BRILINTA 60 MG Tabs tablet Generic drug:  ticagrelor Take 60 mg by mouth 2 (two) times daily.   clonazePAM 0.5 MG tablet Commonly known as:  KLONOPIN Take 0.5 mg by mouth 3 (three) times daily.   diclofenac 50 MG EC tablet Commonly known as:  VOLTAREN Take 1 tablet (50 mg total) by mouth 2 (two) times daily.   doxycycline 100 MG tablet Commonly known as:  VIBRA-TABS Take 1 tablet (100 mg total) by mouth every 12 (twelve) hours.   finasteride 5 MG tablet Commonly known as:  PROSCAR Take 5 mg by mouth daily.   folic acid 1 MG tablet Commonly known as:  FOLVITE Take 1 mg by mouth daily.   hydrochlorothiazide 25 MG tablet Commonly known as:  HYDRODIURIL Take 25 mg by mouth daily.   HYDROcodone-acetaminophen 5-325 MG tablet Commonly known as:  NORCO/VICODIN Take one tab po q 4-6 hrs prn pain What changed:  how much to take  how to take this  when to take this  reasons to take this  additional instructions   lisinopril 20 MG tablet Commonly known as:  PRINIVIL,ZESTRIL Take 20 mg by mouth daily.   LYRICA 100 MG capsule Generic drug:  pregabalin Take 100-300 mg by mouth 2 (two) times daily. 1 capsule in the morning and 3 capsules at bedtime   mirtazapine 30 MG tablet Commonly known as:  REMERON Take 30 mg by mouth at bedtime.     nitroGLYCERIN 0.4 MG SL tablet Commonly known as:  NITROSTAT take one tablet by mouth as directed/as needed for chest pain. Take one tablet every 5 minutes. If no relief after 3 doses, call 911   OLANZapine 10 MG tablet Commonly known as:  ZYPREXA Take 10 mg by mouth at bedtime.   OLANZapine 2.5 MG tablet Commonly known as:  ZYPREXA Take 2.5 mg by mouth every morning.   Omeprazole 20 MG Tbec Take 20 mg by mouth daily.   oxybutynin 15 MG 24 hr tablet Commonly known as:  DITROPAN XL Take 15 mg by mouth at bedtime.   pravastatin 40 MG tablet Commonly known as:  PRAVACHOL Take 40 mg by mouth at bedtime.   PROAIR HFA 108 (90 Base) MCG/ACT inhaler Generic drug:  albuterol Inhale 2 puffs into the lungs every 6 (six) hours as needed for wheezing or shortness of breath. Shortness of breath and wheezing   risperiDONE 2 MG tablet Commonly known as:  RISPERDAL Take 2 mg by mouth at bedtime.   sertraline 100 MG tablet Commonly known as:  ZOLOFT Take 100 mg by mouth daily.   SPIRIVA HANDIHALER 18 MCG inhalation capsule Generic drug:  tiotropium Place 1 puff into inhaler and inhale daily.   tamsulosin 0.4 MG Caps capsule Commonly known as:  FLOMAX Take 0.4 mg by mouth every evening.   temazepam 15 MG capsule Commonly known as:  RESTORIL Take 15 mg by mouth at bedtime.   traZODone 100 MG tablet Commonly known as:  DESYREL Take 50 mg by mouth at bedtime.   vitamin B-12 100 MCG tablet Commonly known as:  CYANOCOBALAMIN Take 100 mcg by mouth daily.   Vitamin D (Ergocalciferol) 50000 units Caps capsule Commonly known as:  DRISDOL Take 50,000 Units by mouth every 7 (seven) days.      Allergies  Allergen Reactions  . Penicillins Rash    Has patient had a PCN reaction causing immediate rash, facial/tongue/throat swelling, SOB or lightheadedness with hypotension: Yes Has patient had a PCN reaction causing severe rash involving mucus membranes or skin necrosis: No Has  patient had a PCN reaction that required hospitalization No Has patient had a PCN reaction occurring within the last 10 years: No If all of the above answers are "NO", then may proceed with Cephalosporin use.     The results of significant diagnostics from this hospitalization (including imaging, microbiology, ancillary and laboratory) are listed below for reference.    Significant Diagnostic Studies: Ct Pelvis W Contrast  Result Date: 11/10/2015 CLINICAL DATA:  Boil  on the left buttock for 3 days with fever EXAM: CT PELVIS WITH CONTRAST TECHNIQUE: Multidetector CT imaging of the pelvis was performed using the standard protocol following the bolus administration of intravenous contrast. CONTRAST:  100mL ISOVUE-300 IOPAMIDOL (ISOVUE-300) INJECTION 61% COMPARISON:  None. FINDINGS: Urinary Tract: Urinary bladder is dilated. Bladder otherwise unremarkable. No distal ureteral dilatation. Bowel: Partially visualized appendix within normal limits. Multiple sigmoid colon diverticula without focal wall thickening. Vascular/Lymphatic: Atherosclerotic calcifications  in the aorta. No aneurysm. There are several enlarged left pelvic sidewall lymph nodes, measuring up to 8 mm in short axis. Enlarged left external iliac lymph node measuring 1.1 cm. Reproductive:  No mass or other significant abnormality Other:  No free fluid. Musculoskeletal: There is focal skin thickening of the left gluteal region. Moderate edema and soft tissue stranding is present. There is a more focal soft tissue density measuring 3.3 by 1.9 cm within the subcutaneous fat of the left parasagittal gluteal soft tissues which may represent a phlegmon. There is no well-formed abscess or rim enhancing fluid collection at this time. IMPRESSION: 1. Left gluteal region soft tissue thickening with moderate edema and subcutaneous soft tissue stranding. Focal 3.3 cm soft tissue density within the subcutaneous of the medial left gluteal tissues suspicious  for a phlegmon. There is no well-formed abscess or rim enhancing fluid collection at this time. 2. 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 3. Enlarged left pelvic sidewall and iliac nodes, likely reactive. Electronically Signed   By: Jasmine Pang M.D.   On: 11/10/2015 19:09   Microbiology: Recent Results (from the past 240 hour(s))  Culture, blood (routine x 2)     Status: None (Preliminary result)   Collection Time: 11/10/15  8:34 PM  Result Value Ref Range Status   Specimen Description BLOOD RIGHT HAND  Final   Special Requests BOTTLES DRAWN AEROBIC AND ANAEROBIC 8CC  Final   Culture NO GROWTH 3 DAYS  Final   Report Status PENDING  Incomplete  Culture, blood (routine x 2)     Status: None (Preliminary result)   Collection Time: 11/10/15  8:59 PM  Result Value Ref Range Status   Specimen Description BLOOD LEFT HAND  Final   Special Requests BOTTLES DRAWN AEROBIC AND ANAEROBIC 8CC  Final   Culture NO GROWTH 3 DAYS  Final   Report Status PENDING  Incomplete     Labs: Basic Metabolic Panel:  Recent Labs Lab 11/10/15 1637 11/10/15 1708 11/11/15 0626 11/12/15 0626 11/13/15 0505  NA 135 137 137 138 138  K 3.3* 3.2* 2.9* 3.7 3.4*  CL 100* 93* 105 108 108  CO2 28  --  23 23 23   GLUCOSE 107* 102* 107* 111* 96  BUN 20 28* 17 11 10   CREATININE 1.49* 1.50* 1.16 0.88 0.96  CALCIUM 9.0  --  8.5* 8.9 8.7*   Liver Function Tests:  Recent Labs Lab 11/10/15 1637 11/11/15 0626 11/12/15 0626  AST 17 22 24   ALT 17 17 21   ALKPHOS 64 65 64  BILITOT 0.8 0.4 0.6  PROT 8.3* 7.4 7.2  ALBUMIN 3.7 3.3* 3.0*   CBC:  Recent Labs Lab 11/10/15 1637 11/10/15 1708 11/11/15 0626  WBC 10.5  --  8.4  NEUTROABS 7.7  --  5.7  HGB 13.2 13.9 12.1*  HCT 40.3 41.0 37.2*  MCV 92.9  --  93.7  PLT 176  --  155    Principal Problem:   Cellulitis Active Problems:   Essential hypertension   Hyperglycemia   Time coordinating discharge: 25 minutes  Signed:  Brendia Sacks, MD [facility] 11/13/2015, 12:53 PM

## 2015-11-13 NOTE — Progress Notes (Signed)
SURGICAL PROGRESS NOTE (cpt [id number])  Hospital Day(s): 3.   Post op day(s):  Marland Kitchen.   Interval History: Patient seen and examined, no acute events or new complaints overnight. Patient reports his pain is overall controlled and that he can feel the Left buttock area of pain and swelling feels "smaller" than when he presented (though still tender), denies fever/chills, CP, or SOB and has been ambulating.  Review of Systems:  Constitutional: denies fever, chills  HEENT: denies cough or congestion  Respiratory: denies any shortness of breath  Cardiovascular: denies chest pain or palpitations  Gastrointestinal: denies N/V, diarrhea or constipation  Musculoskeletal: denies pain, decreased motor or sensation  Neurological: denies HA or vision/hearing changes   Vital signs in last 24 hours: [min-max] current  Temp:  [97.5 F (36.4 C)-98.2 F (36.8 C)] 98.2 F (36.8 C) (10/30 0614) Pulse Rate:  [56-58] 58 (10/30 0614) Resp:  [16] 16 (10/30 0614) BP: (126-132)/(73-82) 126/77 (10/30 0614) SpO2:  [96 %-100 %] 96 % (10/30 0738) Weight:  [84.6 kg (186 lb 6.4 oz)] 84.6 kg (186 lb 6.4 oz) (10/30 0500)     Height: 5\' 8"  (172.7 cm) Weight: 84.6 kg (186 lb 6.4 oz) BMI (Calculated): 28   Intake/Output this shift:  No intake/output data recorded.   Intake/Output last 2 shifts:  @IOLAST2SHIFTS @   Physical Exam:  Constitutional: alert, cooperative and no distress  HENT: normocephalic without obvious abnormality  Eyes: PERRL, EOM's grossly intact and symmetric  Neuro: CN II - XII grossly intact and symmetric without deficit  Respiratory: breathing non-labored at rest  Cardiovascular: regular rate and sinus rhythm  Gastrointestinal: soft, non-tender, and non-distended  Musculoskeletal: UE and LE FROM, motor and sensation grossly intact, NT Integumentary/Skin: slightly decreased Left buttock erythema and tender induration with central 5 mm soft focus that appears to have already drained without any  further fluctuance to suggest underlying abscess, minimal drainage  Labs:  CBC:  Lab Results  Component Value Date   WBC 8.4 11/11/2015   RBC 3.97 (L) 11/11/2015   BMP:  Lab Results  Component Value Date   GLUCOSE 96 11/13/2015   CO2 23 11/13/2015   BUN 10 11/13/2015   CREATININE 0.96 11/13/2015   CALCIUM 8.7 (L) 11/13/2015     Imaging studies: No new pertinent imaging studies   Assessment/Plan: (ICD-10's: L03.317) 60 y.o. male with Left buttock induration/phlegmon with cellulitis, complicated by resolved hypotension likely attributable to hypovolemia (in absence of leukocytosis or fever) and also resolving AKI (associated with infection as well as possibly contrast-induced nephropathy) and by pertinent comorbidities including HTN, CAD s/p PCI for MI, CKD, COPD (not on home O2), ongoing tobacco abuse, hepatitis C, GERD, and bipolar disorder.              - antibiotics as per medical team              - no discrete abscess to drain at this time, will signoff             - can follow-up outpatient for symptomatic easily reducible Left inguinal hernia, but will first need to quit smoking and will need pre-op medical evaluation, particularly cardiac and pulmonary, before considering elective hernia surgery             - previously demonstrated to patient how to self-reduce his Left inguinal hernia and explained signs/symptoms for which to seek more urgent attention/care             - smoking cessation strongly  advised and discussed, especially considering already COPD and CAD             - ambulation encouraged, DVT prophylaxis  All of the above findings and recommendations were discussed with the patient, and all of patient's questions were answered to his expressed satisfaction.  Thank you for the opportunity to participate in this patient's care.   -- Scherrie GerlachJason E. Earlene Plateravis, MD, RPVI [place] Office: [phone]

## 2015-11-15 LAB — CULTURE, BLOOD (ROUTINE X 2)
CULTURE: NO GROWTH
Culture: NO GROWTH

## 2015-11-28 ENCOUNTER — Encounter (HOSPITAL_COMMUNITY): Payer: Self-pay | Admitting: Emergency Medicine

## 2015-11-28 ENCOUNTER — Emergency Department (HOSPITAL_COMMUNITY)
Admission: EM | Admit: 2015-11-28 | Discharge: 2015-11-28 | Disposition: A | Discharge status: Home / Self Care | Payer: Medicaid - Out of State | Attending: Emergency Medicine | Admitting: Emergency Medicine

## 2015-11-28 DIAGNOSIS — I10 Essential (primary) hypertension: Secondary | ICD-10-CM | POA: Diagnosis not present

## 2015-11-28 DIAGNOSIS — I2510 Atherosclerotic heart disease of native coronary artery without angina pectoris: Secondary | ICD-10-CM | POA: Diagnosis not present

## 2015-11-28 DIAGNOSIS — Z79899 Other long term (current) drug therapy: Secondary | ICD-10-CM | POA: Insufficient documentation

## 2015-11-28 DIAGNOSIS — F17200 Nicotine dependence, unspecified, uncomplicated: Secondary | ICD-10-CM | POA: Diagnosis not present

## 2015-11-28 DIAGNOSIS — M25512 Pain in left shoulder: Secondary | ICD-10-CM | POA: Insufficient documentation

## 2015-11-28 DIAGNOSIS — J449 Chronic obstructive pulmonary disease, unspecified: Secondary | ICD-10-CM | POA: Diagnosis not present

## 2015-11-28 NOTE — ED Notes (Signed)
Pt upset he did not receive prescription pain medication. Pt instructed to take otc ibuprofen or tylenol and use ice/heat as needed.

## 2015-11-28 NOTE — ED Triage Notes (Signed)
Patient complaining of left shoulder pain. States he was seen here recently for same and unable to follow up with PCP until December.

## 2015-12-01 ENCOUNTER — Encounter (HOSPITAL_COMMUNITY): Payer: Self-pay | Admitting: Emergency Medicine

## 2015-12-01 ENCOUNTER — Observation Stay (HOSPITAL_COMMUNITY)
Admission: EM | Admit: 2015-12-01 | Discharge: 2015-12-03 | Disposition: A | Discharge status: Home / Self Care | Payer: Medicaid - Out of State | Attending: Internal Medicine | Admitting: Internal Medicine

## 2015-12-01 ENCOUNTER — Emergency Department (HOSPITAL_COMMUNITY): Payer: Medicaid - Out of State

## 2015-12-01 DIAGNOSIS — Y929 Unspecified place or not applicable: Secondary | ICD-10-CM | POA: Insufficient documentation

## 2015-12-01 DIAGNOSIS — F17200 Nicotine dependence, unspecified, uncomplicated: Secondary | ICD-10-CM | POA: Insufficient documentation

## 2015-12-01 DIAGNOSIS — Y999 Unspecified external cause status: Secondary | ICD-10-CM | POA: Diagnosis not present

## 2015-12-01 DIAGNOSIS — R42 Dizziness and giddiness: Secondary | ICD-10-CM

## 2015-12-01 DIAGNOSIS — J449 Chronic obstructive pulmonary disease, unspecified: Secondary | ICD-10-CM | POA: Insufficient documentation

## 2015-12-01 DIAGNOSIS — I951 Orthostatic hypotension: Secondary | ICD-10-CM

## 2015-12-01 DIAGNOSIS — I2510 Atherosclerotic heart disease of native coronary artery without angina pectoris: Secondary | ICD-10-CM | POA: Diagnosis present

## 2015-12-01 DIAGNOSIS — Z7982 Long term (current) use of aspirin: Secondary | ICD-10-CM | POA: Insufficient documentation

## 2015-12-01 DIAGNOSIS — B1920 Unspecified viral hepatitis C without hepatic coma: Secondary | ICD-10-CM | POA: Diagnosis present

## 2015-12-01 DIAGNOSIS — Y939 Activity, unspecified: Secondary | ICD-10-CM | POA: Diagnosis not present

## 2015-12-01 DIAGNOSIS — S0990XA Unspecified injury of head, initial encounter: Secondary | ICD-10-CM | POA: Diagnosis present

## 2015-12-01 DIAGNOSIS — I10 Essential (primary) hypertension: Secondary | ICD-10-CM | POA: Diagnosis not present

## 2015-12-01 DIAGNOSIS — Z955 Presence of coronary angioplasty implant and graft: Secondary | ICD-10-CM | POA: Insufficient documentation

## 2015-12-01 DIAGNOSIS — R55 Syncope and collapse: Principal | ICD-10-CM | POA: Insufficient documentation

## 2015-12-01 DIAGNOSIS — Z720 Tobacco use: Secondary | ICD-10-CM | POA: Diagnosis present

## 2015-12-01 DIAGNOSIS — Z79899 Other long term (current) drug therapy: Secondary | ICD-10-CM | POA: Diagnosis not present

## 2015-12-01 DIAGNOSIS — W1800XA Striking against unspecified object with subsequent fall, initial encounter: Secondary | ICD-10-CM | POA: Insufficient documentation

## 2015-12-01 DIAGNOSIS — F329 Major depressive disorder, single episode, unspecified: Secondary | ICD-10-CM | POA: Diagnosis present

## 2015-12-01 LAB — COMPREHENSIVE METABOLIC PANEL
ALK PHOS: 66 U/L (ref 38–126)
ALT: 16 U/L — AB (ref 17–63)
AST: 22 U/L (ref 15–41)
Albumin: 3.4 g/dL — ABNORMAL LOW (ref 3.5–5.0)
Anion gap: 6 (ref 5–15)
BUN: 19 mg/dL (ref 6–20)
CALCIUM: 8.9 mg/dL (ref 8.9–10.3)
CHLORIDE: 106 mmol/L (ref 101–111)
CO2: 24 mmol/L (ref 22–32)
CREATININE: 1.44 mg/dL — AB (ref 0.61–1.24)
GFR calc Af Amer: 59 mL/min — ABNORMAL LOW (ref >60)
GFR, EST NON AFRICAN AMERICAN: 51 mL/min — AB (ref >60)
Glucose, Bld: 96 mg/dL (ref 65–99)
Potassium: 3.1 mmol/L — ABNORMAL LOW (ref 3.5–5.1)
SODIUM: 136 mmol/L (ref 135–145)
Total Bilirubin: 0.3 mg/dL (ref 0.3–1.2)
Total Protein: 7.2 g/dL (ref 6.5–8.1)

## 2015-12-01 LAB — CBC WITH DIFFERENTIAL/PLATELET
BASOS PCT: 0 %
Basophils Absolute: 0 10*3/uL (ref 0.0–0.1)
EOS PCT: 2 %
Eosinophils Absolute: 0.1 10*3/uL (ref 0.0–0.7)
HCT: 35.5 % — ABNORMAL LOW (ref 39.0–52.0)
Hemoglobin: 11.9 g/dL — ABNORMAL LOW (ref 13.0–17.0)
LYMPHS ABS: 1 10*3/uL (ref 0.7–4.0)
Lymphocytes Relative: 30 %
MCH: 30.7 pg (ref 26.0–34.0)
MCHC: 33.5 g/dL (ref 30.0–36.0)
MCV: 91.7 fL (ref 78.0–100.0)
MONOS PCT: 12 %
Monocytes Absolute: 0.4 10*3/uL (ref 0.1–1.0)
Neutro Abs: 1.9 10*3/uL (ref 1.7–7.7)
Neutrophils Relative %: 56 %
PLATELETS: 106 10*3/uL — AB (ref 150–400)
RBC: 3.87 MIL/uL — ABNORMAL LOW (ref 4.22–5.81)
RDW: 14.9 % (ref 11.5–15.5)
WBC: 3.3 10*3/uL — ABNORMAL LOW (ref 4.0–10.5)

## 2015-12-01 LAB — RAPID URINE DRUG SCREEN, HOSP PERFORMED
AMPHETAMINES: NOT DETECTED
BARBITURATES: NOT DETECTED
Benzodiazepines: POSITIVE — AB
Cocaine: NOT DETECTED
Opiates: NOT DETECTED
TETRAHYDROCANNABINOL: NOT DETECTED

## 2015-12-01 LAB — I-STAT TROPONIN, ED: Troponin i, poc: 0.01 ng/mL (ref 0.00–0.08)

## 2015-12-01 LAB — D-DIMER, QUANTITATIVE (NOT AT ARMC): D DIMER QUANT: 0.36 ug{FEU}/mL (ref 0.00–0.50)

## 2015-12-01 LAB — ETHANOL: Alcohol, Ethyl (B): 5 mg/dL — ABNORMAL HIGH (ref <5)

## 2015-12-01 MED ORDER — VITAMIN B-12 100 MCG PO TABS
100.0000 ug | ORAL_TABLET | Freq: Every day | ORAL | Status: DC
  Administered 2015-12-02 – 2015-12-03 (×2): 100 ug via ORAL
  Filled 2015-12-01 (×3): qty 1

## 2015-12-01 MED ORDER — RISPERIDONE 1 MG PO TABS
2.0000 mg | ORAL_TABLET | Freq: Every day | ORAL | Status: DC
  Administered 2015-12-01 – 2015-12-02 (×2): 2 mg via ORAL
  Filled 2015-12-01 (×2): qty 2

## 2015-12-01 MED ORDER — TICAGRELOR 60 MG PO TABS
60.0000 mg | ORAL_TABLET | Freq: Two times a day (BID) | ORAL | Status: DC
  Administered 2015-12-02 – 2015-12-03 (×3): 60 mg via ORAL
  Filled 2015-12-01 (×6): qty 1

## 2015-12-01 MED ORDER — NITROGLYCERIN 0.4 MG SL SUBL: 0.4000 mg | SUBLINGUAL_TABLET | SUBLINGUAL | Status: DC | PRN

## 2015-12-01 MED ORDER — SODIUM CHLORIDE 0.9 % IV SOLN
INTRAVENOUS | Status: AC
  Administered 2015-12-01 – 2015-12-02 (×2): via INTRAVENOUS

## 2015-12-01 MED ORDER — PRAVASTATIN SODIUM 40 MG PO TABS
40.0000 mg | ORAL_TABLET | Freq: Every day | ORAL | Status: DC
  Administered 2015-12-01 – 2015-12-02 (×2): 40 mg via ORAL
  Filled 2015-12-01 (×2): qty 1

## 2015-12-01 MED ORDER — HYDROCODONE-ACETAMINOPHEN 5-325 MG PO TABS
1.0000 | ORAL_TABLET | Freq: Four times a day (QID) | ORAL | Status: DC | PRN
  Administered 2015-12-01 – 2015-12-02 (×2): 1 via ORAL
  Filled 2015-12-01 (×2): qty 1

## 2015-12-01 MED ORDER — ASPIRIN EC 81 MG PO TBEC
81.0000 mg | DELAYED_RELEASE_TABLET | Freq: Every day | ORAL | Status: DC
  Administered 2015-12-02 – 2015-12-03 (×2): 81 mg via ORAL
  Filled 2015-12-01 (×2): qty 1

## 2015-12-01 MED ORDER — ONDANSETRON HCL 4 MG/2ML IJ SOLN: 4.0000 mg | Freq: Four times a day (QID) | INTRAMUSCULAR | Status: DC | PRN

## 2015-12-01 MED ORDER — ONDANSETRON HCL 4 MG PO TABS: 4.0000 mg | ORAL_TABLET | Freq: Four times a day (QID) | ORAL | Status: DC | PRN

## 2015-12-01 MED ORDER — SODIUM CHLORIDE 0.9 % IV BOLUS (SEPSIS)
1000.0000 mL | Freq: Once | INTRAVENOUS | Status: AC
Start: 2015-12-01 — End: 2015-12-01
  Administered 2015-12-01: 1000 mL via INTRAVENOUS

## 2015-12-01 MED ORDER — PANTOPRAZOLE SODIUM 40 MG PO TBEC
40.0000 mg | DELAYED_RELEASE_TABLET | Freq: Every day | ORAL | Status: DC
  Administered 2015-12-02 – 2015-12-03 (×2): 40 mg via ORAL
  Filled 2015-12-01 (×2): qty 1

## 2015-12-01 MED ORDER — OLANZAPINE 5 MG PO TABS
2.5000 mg | ORAL_TABLET | Freq: Every morning | ORAL | Status: DC
  Administered 2015-12-02 – 2015-12-03 (×2): 2.5 mg via ORAL
  Filled 2015-12-01 (×2): qty 1

## 2015-12-01 MED ORDER — ALBUTEROL SULFATE (2.5 MG/3ML) 0.083% IN NEBU: 2.5000 mg | INHALATION_SOLUTION | RESPIRATORY_TRACT | Status: DC | PRN

## 2015-12-01 MED ORDER — TIOTROPIUM BROMIDE MONOHYDRATE 18 MCG IN CAPS: 18.0000 ug | ORAL_CAPSULE | Freq: Every day | RESPIRATORY_TRACT | Status: DC

## 2015-12-01 MED ORDER — PREGABALIN 50 MG PO CAPS
100.0000 mg | ORAL_CAPSULE | Freq: Every day | ORAL | Status: DC
  Administered 2015-12-02 – 2015-12-03 (×2): 100 mg via ORAL
  Filled 2015-12-01: qty 2
  Filled 2015-12-01: qty 4

## 2015-12-01 MED ORDER — FINASTERIDE 5 MG PO TABS
5.0000 mg | ORAL_TABLET | Freq: Every day | ORAL | Status: DC
  Administered 2015-12-02 – 2015-12-03 (×2): 5 mg via ORAL
  Filled 2015-12-01 (×3): qty 1

## 2015-12-01 MED ORDER — MECLIZINE HCL 12.5 MG PO TABS
50.0000 mg | ORAL_TABLET | Freq: Once | ORAL | Status: AC
  Administered 2015-12-01: 50 mg via ORAL
  Filled 2015-12-01: qty 4

## 2015-12-01 MED ORDER — MIRTAZAPINE 30 MG PO TABS
30.0000 mg | ORAL_TABLET | Freq: Every day | ORAL | Status: DC
  Administered 2015-12-01 – 2015-12-02 (×2): 30 mg via ORAL
  Filled 2015-12-01 (×2): qty 1

## 2015-12-01 MED ORDER — ACETAMINOPHEN 325 MG PO TABS
650.0000 mg | ORAL_TABLET | Freq: Four times a day (QID) | ORAL | Status: DC | PRN
  Administered 2015-12-02 (×2): 650 mg via ORAL
  Filled 2015-12-01 (×2): qty 2

## 2015-12-01 MED ORDER — TEMAZEPAM 15 MG PO CAPS
15.0000 mg | ORAL_CAPSULE | Freq: Every day | ORAL | Status: DC
  Administered 2015-12-01 – 2015-12-02 (×2): 15 mg via ORAL
  Filled 2015-12-01 (×2): qty 1

## 2015-12-01 MED ORDER — OXYBUTYNIN CHLORIDE ER 5 MG PO TB24
15.0000 mg | ORAL_TABLET | Freq: Every day | ORAL | Status: DC
  Administered 2015-12-01 – 2015-12-02 (×2): 15 mg via ORAL
  Filled 2015-12-01 (×2): qty 3

## 2015-12-01 MED ORDER — ACETAMINOPHEN 500 MG PO TABS
1000.0000 mg | ORAL_TABLET | Freq: Once | ORAL | Status: AC
  Administered 2015-12-01: 1000 mg via ORAL
  Filled 2015-12-01: qty 2

## 2015-12-01 MED ORDER — SODIUM CHLORIDE 0.9 % IV BOLUS (SEPSIS)
1000.0000 mL | Freq: Once | INTRAVENOUS | Status: AC
  Administered 2015-12-01: 1000 mL via INTRAVENOUS

## 2015-12-01 MED ORDER — OLANZAPINE 5 MG PO TABS
10.0000 mg | ORAL_TABLET | Freq: Every day | ORAL | Status: DC
  Administered 2015-12-01 – 2015-12-02 (×2): 10 mg via ORAL
  Filled 2015-12-01 (×2): qty 2

## 2015-12-01 MED ORDER — IBUPROFEN 400 MG PO TABS
600.0000 mg | ORAL_TABLET | Freq: Once | ORAL | Status: AC
  Administered 2015-12-01: 600 mg via ORAL
  Filled 2015-12-01: qty 2

## 2015-12-01 MED ORDER — AMLODIPINE BESYLATE 5 MG PO TABS
10.0000 mg | ORAL_TABLET | Freq: Every day | ORAL | Status: DC
  Administered 2015-12-02 – 2015-12-03 (×2): 10 mg via ORAL
  Filled 2015-12-01 (×2): qty 2

## 2015-12-01 MED ORDER — ATENOLOL 25 MG PO TABS
50.0000 mg | ORAL_TABLET | Freq: Two times a day (BID) | ORAL | Status: DC
  Administered 2015-12-02 – 2015-12-03 (×2): 50 mg via ORAL
  Filled 2015-12-01 (×3): qty 2

## 2015-12-01 MED ORDER — TRAZODONE HCL 50 MG PO TABS
50.0000 mg | ORAL_TABLET | Freq: Every day | ORAL | Status: DC
  Administered 2015-12-01 – 2015-12-02 (×2): 50 mg via ORAL
  Filled 2015-12-01 (×2): qty 1

## 2015-12-01 MED ORDER — ALBUTEROL SULFATE HFA 108 (90 BASE) MCG/ACT IN AERS: 2.0000 | INHALATION_SPRAY | Freq: Four times a day (QID) | RESPIRATORY_TRACT | Status: DC | PRN

## 2015-12-01 MED ORDER — METHYLPREDNISOLONE SODIUM SUCC 125 MG IJ SOLR
60.0000 mg | Freq: Once | INTRAMUSCULAR | Status: AC
  Administered 2015-12-01: 60 mg via INTRAVENOUS
  Filled 2015-12-01: qty 2

## 2015-12-01 MED ORDER — HYDRALAZINE HCL 20 MG/ML IJ SOLN
10.0000 mg | Freq: Four times a day (QID) | INTRAMUSCULAR | Status: DC | PRN
  Administered 2015-12-02: 10 mg via INTRAVENOUS
  Filled 2015-12-01: qty 1

## 2015-12-01 MED ORDER — POTASSIUM CHLORIDE CRYS ER 20 MEQ PO TBCR
40.0000 meq | EXTENDED_RELEASE_TABLET | Freq: Once | ORAL | Status: AC
  Administered 2015-12-01: 40 meq via ORAL
  Filled 2015-12-01: qty 2

## 2015-12-01 MED ORDER — UMECLIDINIUM-VILANTEROL 62.5-25 MCG/INH IN AEPB
1.0000 | INHALATION_SPRAY | Freq: Every day | RESPIRATORY_TRACT | Status: DC
  Administered 2015-12-02 – 2015-12-03 (×2): 1 via RESPIRATORY_TRACT
  Filled 2015-12-01: qty 14

## 2015-12-01 MED ORDER — SERTRALINE HCL 50 MG PO TABS
100.0000 mg | ORAL_TABLET | Freq: Every day | ORAL | Status: DC
  Administered 2015-12-02 – 2015-12-03 (×2): 100 mg via ORAL
  Filled 2015-12-01 (×2): qty 2

## 2015-12-01 MED ORDER — FOLIC ACID 1 MG PO TABS
1.0000 mg | ORAL_TABLET | Freq: Every day | ORAL | Status: DC
  Administered 2015-12-02 – 2015-12-03 (×2): 1 mg via ORAL
  Filled 2015-12-01 (×2): qty 1

## 2015-12-01 MED ORDER — MECLIZINE HCL 12.5 MG PO TABS
25.0000 mg | ORAL_TABLET | Freq: Three times a day (TID) | ORAL | Status: DC
  Administered 2015-12-01 – 2015-12-03 (×5): 25 mg via ORAL
  Filled 2015-12-01 (×5): qty 2

## 2015-12-01 MED ORDER — HEPARIN SODIUM (PORCINE) 5000 UNIT/ML IJ SOLN
5000.0000 [IU] | Freq: Three times a day (TID) | INTRAMUSCULAR | Status: DC
  Administered 2015-12-01 – 2015-12-03 (×5): 5000 [IU] via SUBCUTANEOUS
  Filled 2015-12-01 (×5): qty 1

## 2015-12-01 MED ORDER — PREGABALIN 75 MG PO CAPS
300.0000 mg | ORAL_CAPSULE | Freq: Every day | ORAL | Status: DC
  Administered 2015-12-01 – 2015-12-02 (×2): 300 mg via ORAL
  Filled 2015-12-01 (×2): qty 4

## 2015-12-01 MED ORDER — TAMSULOSIN HCL 0.4 MG PO CAPS
0.4000 mg | ORAL_CAPSULE | Freq: Every evening | ORAL | Status: DC
  Administered 2015-12-02: 0.4 mg via ORAL
  Filled 2015-12-01: qty 1

## 2015-12-01 MED ORDER — CLONAZEPAM 0.5 MG PO TABS
0.5000 mg | ORAL_TABLET | Freq: Three times a day (TID) | ORAL | Status: DC
  Administered 2015-12-01 – 2015-12-03 (×5): 0.5 mg via ORAL
  Filled 2015-12-01 (×5): qty 1

## 2015-12-01 MED ORDER — VITAMIN D (ERGOCALCIFEROL) 1.25 MG (50000 UNIT) PO CAPS
50000.0000 [IU] | ORAL_CAPSULE | ORAL | Status: DC
  Filled 2015-12-01: qty 1

## 2015-12-01 NOTE — ED Notes (Signed)
ED Provider at bedside. 

## 2015-12-01 NOTE — ED Notes (Signed)
Attempted to call report

## 2015-12-01 NOTE — ED Provider Notes (Signed)
[facility] DEPT Provider Note   CSN: [id number] Arrival date & time: 12/01/15  1008     History   Chief Complaint Chief Complaint  Patient presents with  . Loss of Consciousness    HPI Casey Walker is a 60 y.o. male.  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  Pt reports he has had at least 5 episodes of syncope in which, most of the time, he felt lightheaded prior to the episode.  He is currently c/o headache from falling and hitting his head multiple times.  Reports he has had chest pain that began 1 week ago, the pain comes and goes, lasts seconds to minutes, once 3 days ago was more severe on the right chest radiating into the jaw, usually goes away with nitroglycerin.  Does note he has chronic chest pain and experiences similar chest pain daily. Has chronic SOB from COPD that is unchanged.No sputum change.   Reports eating and drinking well, denies medication changes, no recent travel, no unilateral leg swelling, denies ETOH use.    Past Medical History:  Diagnosis Date  . CAD (coronary artery disease[place]  . 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 COPD (chronic obstructive pulmonary disease) (HCC)   . Hepatitis C   . Hypertension   . Kidney disorder    due to hepatitis per patient    Patient Active Problem List   Diagnosis Date Noted  . Cellulitis 11/10/2015  . Hyperglycemia 11/10/2015  . Syncope 02/03/2015  . Major depression 02/03/2015  . 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 Kidney disorder   . HCV (hepatitis C virus) 11/08/2010  . Arteriosclerosis of coronary artery 10/31/2010  . Essential hypertension 10/31/2010  . Seizure (HCC) 10/31/2010  . Current tobacco use 10/31/2010    Past Surgical History:  Procedure Laterality Date  . CORONARY STENT PLACEMENT    . ELBOW SURGERY    . TRANSURETHRAL RESECTION OF PROSTATE         Home  Medications    Prior to Admission medications   Medication Sig Start Date End Date Taking? Authorizing Provider  albuterol (PROAIR HFA) 108 (90 Base) MCG/ACT inhaler Inhale 2 puffs into the lungs every 6 (six) hours as needed for wheezing or shortness of breath. Shortness of breath and wheezing 06/21/12  Yes Historical Provider, MD  ANORO ELLIPTA 62.5-25 MCG/INH AEPB Inhale 1 puff into the lungs daily. 12/16/14  Yes Historical Provider, MD  aspirin EC 81 MG tablet Take 81 mg by mouth daily.   Yes Historical Provider, MD  atenolol (TENORMIN) 50 MG tablet Take 50 mg by mouth 2 (two) times daily.  05/01/12  Yes Historical Provider, MD  BRILINTA 60 MG TABS tablet Take 60 mg by mouth 2 (two) times daily. 10/17/15  Yes Historical Provider, MD  clonazePAM (KLONOPIN) 0.5 MG tablet Take 0.5 mg by mouth 3 (three) times daily. 01/09/15  Yes Historical Provider, MD  diclofenac (VOLTAREN) 50 MG EC tablet Take 1 tablet (50 mg total) by mouth 2 (two) times daily. 11/07/15  Yes Lonia SkinnerLeslie K Sofia, PA-C  finasteride (PROSCAR) 5 MG tablet Take 5 mg by mouth daily. 12/26/14  Yes Historical Provider, MD  folic acid (FOLVITE) 1 MG tablet Take 1 mg by mouth daily. 12/26/14  Yes Historical Provider, MD  hydrochlorothiazide (HYDRODIURIL) 25 MG tablet Take 25 mg by mouth daily.  05/01/12  Yes Historical Provider, MD  HYDROcodone-acetaminophen (NORCO/VICODIN) 5-325 MG tablet Take one tab po q 4-6 hrs prn pain Patient taking differently: Take 1 tablet by mouth every 4 (four) hours as needed for moderate pain.  11/03/15  Yes Lonia Skinner Sofia, PA-C  lisinopril (PRINIVIL,ZESTRIL) 20 MG tablet Take 20 mg by mouth daily. 10/11/15  Yes Historical Provider, MD  LYRICA 100 MG capsule Take 100-300 mg by mouth 2 (two) times daily. 1 capsule in the morning and 3 capsules at bedtime 01/09/15  Yes Historical Provider, MD  mirtazapine (REMERON) 30 MG tablet Take 30 mg by mouth at bedtime. 12/26/14  Yes Historical Provider, MD  OLANZapine (ZYPREXA) 10  MG tablet Take 10 mg by mouth at bedtime. 10/02/15  Yes Historical Provider, MD  OLANZapine (ZYPREXA) 2.5 MG tablet Take 2.5 mg by mouth every morning. 10/02/15  Yes Historical Provider, MD  Omeprazole 20 MG TBEC Take 20 mg by mouth daily. 12/23/14  Yes Historical Provider, MD  oxybutynin (DITROPAN XL) 15 MG 24 hr tablet Take 15 mg by mouth at bedtime.   Yes Historical Provider, MD  pravastatin (PRAVACHOL) 40 MG tablet Take 40 mg by mouth at bedtime. 12/26/14  Yes Historical Provider, MD  risperiDONE (RISPERDAL) 2 MG tablet Take 2 mg by mouth at bedtime. 05/01/12  Yes Historical Provider, MD  sertraline (ZOLOFT) 100 MG tablet Take 100 mg by mouth daily. 12/29/14  Yes Historical Provider, MD  tamsulosin (FLOMAX) 0.4 MG CAPS capsule Take 0.4 mg by mouth every evening. 12/19/14  Yes Historical Provider, MD  temazepam (RESTORIL) 15 MG capsule Take 15 mg by mouth at bedtime. 09/28/15  Yes Historical Provider, MD  traZODone (DESYREL) 100 MG tablet Take 50 mg by mouth at bedtime.  01/11/15  Yes Historical Provider, MD  vitamin B-12 (CYANOCOBALAMIN) 100 MCG tablet Take 100 mcg by mouth daily.   Yes Historical Provider, MD  doxycycline (VIBRA-TABS) 100 MG tablet Take 1 tablet (100 mg total) by mouth every 12 (twelve) hours. 11/13/15   Standley Brooking, MD  nitroGLYCERIN (NITROSTAT) 0.4 MG SL tablet take one tablet by mouth as directed/as needed for chest pain. Take one tablet every 5 minutes. If no relief after 3 doses, call 911 10/13/15   Historical Provider, MD  SPIRIVA HANDIHALER 18 MCG inhalation capsule Place 1 puff into inhaler and inhale daily. 11/20/14   Historical Provider, MD  Vitamin D, Ergocalciferol, (DRISDOL) 50000 units CAPS capsule Take 50,000 Units by mouth every 7 (seven) days.    Historical Provider, MD    Family History Family History  Problem Relation Age of Onset  . Hypertension Mother     Social History Social History  Substance Use Topics  . Smoking status: Current Every Day Smoker      Packs/day: 0.25  . Smokeless tobacco: Former Neurosurgeon  . Alcohol use No     Allergies   Penicillins   Review of Systems Review of Systems  All other systems reviewed and are negative.    Physical Exam Updated Vital Signs BP (!) 187/102   Pulse (!) 47   Temp 97.6 F (36.4 C)   Resp 18   Ht 5\' 8"  (1.727 m)   Wt 81.6 kg   SpO2 98%   BMI 27.37 kg/m   Physical Exam  Constitutional: He appears well-developed and well-nourished. No distress.  HENT:  Head: Normocephalic and atraumatic.  Mouth/Throat: Mucous membranes are dry.  Neck: Neck supple.  Cardiovascular: Normal rate and regular rhythm.   Pulmonary/Chest:  Effort normal and breath sounds normal. No respiratory distress. He has no wheezes. He has no rales.  Abdominal: Soft. He exhibits no distension and no mass. There is no tenderness. There is no rebound and no guarding.  Musculoskeletal: He exhibits no edema.  Neurological: He is alert. He exhibits normal muscle tone.  No gross deficits of cranial nerves, moves extremities equally, 5/5 strength and sensation intact.    Skin: He is not diaphoretic. There is pallor.  Bilateral buttocks/hips without erythema, edema, warmth, tenderness.    Nursing note and vitals reviewed.    ED Treatments / Results  Labs (all labs ordered are listed, but only abnormal results are displayed) Labs Reviewed  COMPREHENSIVE METABOLIC PANEL - Abnormal; Notable for the following:       Result Value   Potassium 3.1 (*)    Creatinine, Ser 1.44 (*)    Albumin 3.4 (*)    ALT 16 (*)    GFR calc non Af Amer 51 (*)    GFR calc Af Amer 59 (*)    All other components within normal limits  CBC WITH DIFFERENTIAL/PLATELET - Abnormal; Notable for the following:    WBC 3.3 (*)    RBC 3.87 (*)    Hemoglobin 11.9 (*)    HCT 35.5 (*)    Platelets 106 (*)    All other components within normal limits  ETHANOL - Abnormal; Notable for the following:    Alcohol, Ethyl (B) 5 (*)    All other  components within normal limits  RAPID URINE DRUG SCREEN, HOSP PERFORMED - Abnormal; Notable for the following:    Benzodiazepines POSITIVE (*)    All other components within normal limits  D-DIMER, QUANTITATIVE (NOT AT [facility])  I-STAT TROPOININ, ED    EKG  EKG Interpretation  Date/Time:  Friday December 01 2015 11:04:09 EST Ventricular Rate:  49 PR Interval:    QRS Duration: 107 QT Interval:  443 QTC Calculation: 400 R Axis:   24 Text Interpretation:  Sinus bradycardia Prolonged PR interval No STEMI. Similar to prior.  Confirmed by LONG MD, JOSHUA [phone]) on 12/01/2015 12:10:49 PM       Radiology Dg Chest 2 View  Result Date: 12/01/2015 CLINICAL DATA:  Multiple falls yesterday and today. EXAM: CHEST  2 VIEW COMPARISON:  02/03/2015. FINDINGS: Poor inspiration. Grossly normal sized heart. Minimal right basilar airspace opacity. No fracture pneumothorax. IMPRESSION: Poor inspiration with minimal right basilar atelectasis. Pneumonia is less likely. Electronically Signed   By: Beckie Salts M.D.   On: 12/01/2015 12:01   Ct Head Wo Contrast  Result Date: 12/01/2015 CLINICAL DATA:  Syncope, fall EXAM: CT HEAD WITHOUT CONTRAST CT CERVICAL SPINE WITHOUT CONTRAST TECHNIQUE: Multidetector CT imaging of the head and cervical spine was performed following the standard protocol without intravenous contrast. Multiplanar CT image reconstructions of the cervical spine were also generated. COMPARISON:  02/03/2015 FINDINGS: CT HEAD FINDINGS Brain: No evidence of acute infarction, hemorrhage, extra-axial collection, ventriculomegaly, or mass effect. 4 mm colloid cyst in the third ventricle unchanged from the prior exam. Generalized cerebral atrophy. Periventricular white matter low attenuation likely secondary to microangiopathy. Vascular: Cerebrovascular atherosclerotic calcifications are noted. Skull: Negative for fracture or focal lesion. Sinuses/Orbits: Visualized portions of the orbits are  unremarkable. Visualized portions of the paranasal sinuses and mastoid air cells are unremarkable. Other: None. CT CERVICAL SPINE FINDINGS Alignment: Normal. Skull base and vertebrae: No acute fracture. No primary bone lesion or focal pathologic process. Soft tissues and spinal canal: No  prevertebral fluid or swelling. No visible canal hematoma. Disc levels: Degenerative disc disease with mild disc height loss at C5-6. At C2-3 there is left facet arthropathy. At C3-4 there is a broad-based disc osteophyte complex with bilateral uncovertebral degenerative changes and bilateral foraminal stenosis. At C4-5 there is a broad-based disc osteophyte complex with left uncovertebral degenerative changes and left facet arthropathy resulting in severe left foraminal stenosis. At C5-6 there is a broad-based disc osteophyte complex with right uncovertebral degenerative changes and severe right foraminal stenosis. Degenerative disc disease with disc height loss at T1-2. Upper chest: Lung apices are clear. Other: No fluid collection or hematoma. IMPRESSION: 1. No acute intracranial pathology. 2. No acute osseous injury of the cervical spine. 3. Cervical spine spondylosis as described above. Electronically Signed   By: Elige KoHetal  Patel   On: 12/01/2015 13:45   Ct Cervical Spine Wo Contrast  Result Date: 12/01/2015 CLINICAL DATA:  Syncope, fall EXAM: CT HEAD WITHOUT CONTRAST CT CERVICAL SPINE WITHOUT CONTRAST TECHNIQUE: Multidetector CT imaging of the head and cervical spine was performed following the standard protocol without intravenous contrast. Multiplanar CT image reconstructions of the cervical spine were also generated. COMPARISON:  02/03/2015 FINDINGS: CT HEAD FINDINGS Brain: No evidence of acute infarction, hemorrhage, extra-axial collection, ventriculomegaly, or mass effect. 4 mm colloid cyst in the third ventricle unchanged from the prior exam. Generalized cerebral atrophy. Periventricular white matter low attenuation  likely secondary to microangiopathy. Vascular: Cerebrovascular atherosclerotic calcifications are noted. Skull: Negative for fracture or focal lesion. Sinuses/Orbits: Visualized portions of the orbits are unremarkable. Visualized portions of the paranasal sinuses and mastoid air cells are unremarkable. Other: None. CT CERVICAL SPINE FINDINGS Alignment: Normal. Skull base and vertebrae: No acute fracture. No primary bone lesion or focal pathologic process. Soft tissues and spinal canal: No prevertebral fluid or swelling. No visible canal hematoma. Disc levels: Degenerative disc disease with mild disc height loss at C5-6. At C2-3 there is left facet arthropathy. At C3-4 there is a broad-based disc osteophyte complex with bilateral uncovertebral degenerative changes and bilateral foraminal stenosis. At C4-5 there is a broad-based disc osteophyte complex with left uncovertebral degenerative changes and left facet arthropathy resulting in severe left foraminal stenosis. At C5-6 there is a broad-based disc osteophyte complex with right uncovertebral degenerative changes and severe right foraminal stenosis. Degenerative disc disease with disc height loss at T1-2. Upper chest: Lung apices are clear. Other: No fluid collection or hematoma. IMPRESSION: 1. No acute intracranial pathology. 2. No acute osseous injury of the cervical spine. 3. Cervical spine spondylosis as described above. Electronically Signed   By: Elige KoHetal  Patel   On: 12/01/2015 13:45    Procedures Procedures (including critical care time)  Medications Ordered in ED Medications  sodium chloride 0.9 % bolus 1,000 mL (0 mLs Intravenous Stopped 12/01/15 1159)  potassium chloride SA (K-DUR,KLOR-CON) CR tablet 40 mEq (40 mEq Oral Given 12/01/15 1306)  sodium chloride 0.9 % bolus 1,000 mL (0 mLs Intravenous Stopped 12/01/15 1414)  acetaminophen (TYLENOL) tablet 1,000 mg (1,000 mg Oral Given 12/01/15 1309)     Initial Impression / Assessment and Plan /  ED Course  I have reviewed the triage vital signs and the nursing notes.  Pertinent labs & imaging results that were available during my care of the patient were reviewed by me and considered in my medical decision making (see chart for details).  Clinical Course     Afebrile nontoxic patient with COPD, Hep C,CAD s/p MI with multiple episodes of syncope and  intermittent CP.  Discussed pt with Dr Jacqulyn Bath who also saw the patient.  Pt hypertensive but slightly bradycardic.  He is unable to stand due to orthostasis.  Clinically dehydrated.  IVF given.  EKG nonischemic, no heart block.  Negative troponin, negative d-dimer.  CT head negative for acute change.  Pt admitted in Jan 2017 for multiple episodes of syncope - at that time pt was hypotensive from being on multiple antihypertensives.  Admitted to [facility] for further evaluation, treatement for orthostasis and syncope.      Final Clinical Impressions(s) / ED Diagnoses   Final diagnoses:  Syncope, unspecified syncope type  Orthostasis    New Prescriptions New Prescriptions   No medications on file     Trixie Dredge, PA-C 12/01/15 1421    Maia Plan, MD 12/01/15 [phone]

## 2015-12-01 NOTE — ED Triage Notes (Signed)
Per EMS, pt stated that fell out twice yesterday starting around 11 am.  Pt hit his head on the left side.  This am pt fell three times since 5 am and the last episode he did not realize how he had gotten in the floor.  Pt states that he has a cyst on his brain.  Pt states that he has a headache and left elbow pain.  VS CBG 161 HR 710 SPO2 97 BP 98/68

## 2015-12-01 NOTE — H&P (Signed)
TRH H&P   Patient Demographics:    Casey Walker, is a 60 y.o. male  MRN: [id number]   DOB - 03/14/1955  Admit Date - 12/01/2015  Outpatient Primary MD for the patient is Arlina Robes, MD  Patient coming from: Home   Chief Complaint  Patient presents with  . Loss of Consciousness      HPI:    Casey Walker  is a 60 y.o. 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 He has associated ringing in the ear, mild nausea, and a general headache. He denies photophobia and neck stiffness. Patient states the problem worsens with head turning and improves with rest and lying straight. While in the ED, head CT was negative. He was admitted for further evaluation of vertigo.    Review of systems:    In addition to the HPI above,  +ve headache, No Fever-chills, No changes with Vision, +ve ringing in his ears  No problems swallowing food or Liquids, No Chest pain, Cough or Shortness of Breath, No Abdominal pain, +ve mild Nausea , Bowel movements are regular, No Blood in stool or Urine, No dysuria, No new skin rashes or bruises, No new joints pains-aches,  No new weakness, tingling, numbness in any extremity, No recent weight gain or loss, No polyuria, polydypsia or polyphagia, No significant Mental Stressors.  A full 10 point Review of Systems was done, except as stated above, all other Review of Systems were negative.   With Past History of the following :    Past Medical History:  Diagnosis Date  . CAD (coronary artery disease)    [place], [place]  . 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 COPD (chronic obstructive pulmonary disease) (HCC)   . Hepatitis C   . Hypertension   . Kidney disorder    due to hepatitis per patient      Past Surgical History:  Procedure Laterality Date  . CORONARY STENT PLACEMENT    . ELBOW SURGERY    . TRANSURETHRAL RESECTION OF PROSTATE        Social History:     Social History  Substance Use Topics  . Smoking status: Current Every Day Smoker  Packs/day: 0.25  . Smokeless tobacco: Former NeurosurgeonUser  . Alcohol use No       Family History :     Family History  Problem Relation Age of Onset  . Hypertension Mother      Home Medications:   Prior to Admission medications   Medication Sig Start Date End Date Taking? Authorizing Provider  albuterol (PROAIR HFA) 108 (90 Base) MCG/ACT inhaler Inhale 2 puffs into the lungs every 6 (six) hours as needed for wheezing or shortness of breath. Shortness of breath and wheezing 06/21/12  Yes Historical Provider, MD  ANORO ELLIPTA 62.5-25 MCG/INH AEPB Inhale 1 puff into the lungs daily. 12/16/14  Yes Historical Provider, MD  aspirin EC 81 MG tablet Take 81 mg by mouth daily.   Yes Historical Provider, MD  atenolol (TENORMIN) 50 MG tablet Take 50 mg by mouth 2 (two) times daily.  05/01/12  Yes Historical Provider, MD  BRILINTA 60 MG TABS tablet Take 60 mg by mouth 2 (two) times daily. 10/17/15  Yes Historical Provider, MD  clonazePAM (KLONOPIN) 0.5 MG tablet Take 0.5 mg by mouth 3 (three) times daily. 01/09/15  Yes Historical Provider, MD  diclofenac (VOLTAREN) 50 MG EC tablet Take 1 tablet (50 mg total) by mouth 2 (two) times daily. 11/07/15  Yes Lonia SkinnerLeslie K Sofia, PA-C  finasteride (PROSCAR) 5 MG tablet Take 5 mg by mouth daily. 12/26/14  Yes Historical Provider, MD  folic acid (FOLVITE) 1 MG tablet Take 1 mg by mouth daily. 12/26/14  Yes Historical Provider, MD  hydrochlorothiazide (HYDRODIURIL) 25 MG tablet Take 25 mg by mouth daily. 05/01/12  Yes Historical Provider, MD    HYDROcodone-acetaminophen (NORCO/VICODIN) 5-325 MG tablet Take one tab po q 4-6 hrs prn pain Patient taking differently: Take 1 tablet by mouth every 4 (four) hours as needed for moderate pain.  11/03/15  Yes Lonia SkinnerLeslie K Sofia, PA-C  lisinopril (PRINIVIL,ZESTRIL) 20 MG tablet Take 20 mg by mouth daily. 10/11/15  Yes Historical Provider, MD  LYRICA 100 MG capsule Take 100-300 mg by mouth 2 (two) times daily. 1 capsule in the morning and 3 capsules at bedtime 01/09/15  Yes Historical Provider, MD  mirtazapine (REMERON) 30 MG tablet Take 30 mg by mouth at bedtime. 12/26/14  Yes Historical Provider, MD  OLANZapine (ZYPREXA) 10 MG tablet Take 10 mg by mouth at bedtime. 10/02/15  Yes Historical Provider, MD  OLANZapine (ZYPREXA) 2.5 MG tablet Take 2.5 mg by mouth every morning. 10/02/15  Yes Historical Provider, MD  Omeprazole 20 MG TBEC Take 20 mg by mouth daily. 12/23/14  Yes Historical Provider, MD  oxybutynin (DITROPAN XL) 15 MG 24 hr tablet Take 15 mg by mouth at bedtime.   Yes Historical Provider, MD  pravastatin (PRAVACHOL) 40 MG tablet Take 40 mg by mouth at bedtime. 12/26/14  Yes Historical Provider, MD  risperiDONE (RISPERDAL) 2 MG tablet Take 2 mg by mouth at bedtime. 05/01/12  Yes Historical Provider, MD  sertraline (ZOLOFT) 100 MG tablet Take 100 mg by mouth daily. 12/29/14  Yes Historical Provider, MD  tamsulosin (FLOMAX) 0.4 MG CAPS capsule Take 0.4 mg by mouth every evening. 12/19/14  Yes Historical Provider, MD  temazepam (RESTORIL) 15 MG capsule Take 15 mg by mouth at bedtime. 09/28/15  Yes Historical Provider, MD  traZODone (DESYREL) 100 MG tablet Take 50 mg by mouth at bedtime.  01/11/15  Yes Historical Provider, MD  vitamin B-12 (CYANOCOBALAMIN) 100 MCG tablet Take 100 mcg by mouth daily.   Yes Historical  Provider, MD  doxycycline (VIBRA-TABS) 100 MG tablet Take 1 tablet (100 mg total) by mouth every 12 (twelve) hours. 11/13/15   Standley Brooking, MD  nitroGLYCERIN (NITROSTAT) 0.4 MG SL  tablet take one tablet by mouth as directed/as needed for chest pain. Take one tablet every 5 minutes. If no relief after 3 doses, call 911 10/13/15   Historical Provider, MD  SPIRIVA HANDIHALER 18 MCG inhalation capsule Place 1 puff into inhaler and inhale daily. 11/20/14   Historical Provider, MD  Vitamin D, Ergocalciferol, (DRISDOL) 50000 units CAPS capsule Take 50,000 Units by mouth every 7 (seven) days.    Historical Provider, MD     Allergies:     Allergies  Allergen Reactions  . Penicillins Rash    Has patient had a PCN reaction causing immediate rash, facial/tongue/throat swelling, SOB or lightheadedness with hypotension: Yes Has patient had a PCN reaction causing severe rash involving mucus membranes or skin necrosis: No Has patient had a PCN reaction that required hospitalization No Has patient had a PCN reaction occurring within the last 10 years: No If all of the above answers are "NO", then may proceed with Cephalosporin use.      Physical Exam:   Vitals  Blood pressure (!) 187/102, pulse (!) 47, temperature 97.6 F (36.4 C), resp. rate 18, height 5\' 8"  (1.727 m), weight 81.6 kg (180 lb), SpO2 98 %.   1. General 60 yom lying in bed in NAD  2. Normal affect and insight, Not Suicidal or Homicidal, Awake Alert, Oriented X 3.  3. No F.N deficits, ALL C.Nerves Intact, Strength 5/5 all 4 extremities, Sensation intact all 4 extremities, Plantars down going. +ve nystagmus  4. Ears and Eyes appear Normal, Conjunctivae clear, PERRLA. Moist Oral Mucosa.  5. Supple Neck, No JVD, No cervical lymphadenopathy appriciated, No Carotid Bruits.  6. Symmetrical Chest wall movement, Good air movement bilaterally, CTAB.  7. RRR, No Gallops, Rubs or Murmurs, No Parasternal Heave.  8. Positive Bowel Sounds, Abdomen Soft, No tenderness, No organomegaly appriciated,No rebound -guarding or rigidity.  9.  No Cyanosis, Normal Skin Turgor, No Skin Rash or Bruise.  10. Good muscle tone,   joints appear normal , no effusions, Normal ROM.  11. No Palpable Lymph Nodes in Neck or Axillae    Data Review:    CBC  Recent Labs Lab 12/01/15 1051  WBC 3.3*  HGB 11.9*  HCT 35.5*  PLT 106*  MCV 91.7  MCH 30.7  MCHC 33.5  RDW 14.9  LYMPHSABS 1.0  MONOABS 0.4  EOSABS 0.1  BASOSABS 0.0   ------------------------------------------------------------------------------------------------------------------  Chemistries   Recent Labs Lab 12/01/15 1051  NA 136  K 3.1*  CL 106  CO2 24  GLUCOSE 96  BUN 19  CREATININE 1.44*  CALCIUM 8.9  AST 22  ALT 16*  ALKPHOS 66  BILITOT 0.3   ------------------------------------------------------------------------------------------------------------------ estimated creatinine clearance is 52.1 mL/min (by C-G formula based on SCr of 1.44 mg/dL (H)). ------------------------------------------------------------------------------------------------------------------ No results for input(s): TSH, T4TOTAL, T3FREE, THYROIDAB in the last 72 hours.  Invalid input(s): FREET3  Coagulation profile No results for input(s): INR, PROTIME in the last 168 hours. -------------------------------------------------------------------------------------------------------------------  Recent Labs  12/01/15 1051  DDIMER 0.36   -------------------------------------------------------------------------------------------------------------------  Cardiac Enzymes No results for input(s): CKMB, TROPONINI, MYOGLOBIN in the last 168 hours.  Invalid input(s): CK ------------------------------------------------------------------------------------------------------------------ No results found for: BNP   ---------------------------------------------------------------------------------------------------------------  Urinalysis    Component Value Date/Time   COLORURINE YELLOW 02/03/2015 1623   APPEARANCEUR CLEAR  02/03/2015 1623   LABSPEC 1.015  02/03/2015 1623   PHURINE 5.5 02/03/2015 1623   GLUCOSEU NEGATIVE 02/03/2015 1623   HGBUR NEGATIVE 02/03/2015 1623   BILIRUBINUR NEGATIVE 02/03/2015 1623   KETONESUR NEGATIVE 02/03/2015 1623   PROTEINUR NEGATIVE 02/03/2015 1623   NITRITE NEGATIVE 02/03/2015 1623   LEUKOCYTESUR NEGATIVE 02/03/2015 1623    ----------------------------------------------------------------------------------------------------------------   Imaging Results:    Dg Chest 2 View  Result Date: 12/01/2015 CLINICAL DATA:  Multiple falls yesterday and today. EXAM: CHEST  2 VIEW COMPARISON:  02/03/2015. FINDINGS: Poor inspiration. Grossly normal sized heart. Minimal right basilar airspace opacity. No fracture pneumothorax. IMPRESSION: Poor inspiration with minimal right basilar atelectasis. Pneumonia is less likely. Electronically Signed   By: Beckie Salts M.D.   On: 12/01/2015 12:01   Ct Head Wo Contrast  Result Date: 12/01/2015 CLINICAL DATA:  Syncope, fall EXAM: CT HEAD WITHOUT CONTRAST CT CERVICAL SPINE WITHOUT CONTRAST TECHNIQUE: Multidetector CT imaging of the head and cervical spine was performed following the standard protocol without intravenous contrast. Multiplanar CT image reconstructions of the cervical spine were also generated. COMPARISON:  02/03/2015 FINDINGS: CT HEAD FINDINGS Brain: No evidence of acute infarction, hemorrhage, extra-axial collection, ventriculomegaly, or mass effect. 4 mm colloid cyst in the third ventricle unchanged from the prior exam. Generalized cerebral atrophy. Periventricular white matter low attenuation likely secondary to microangiopathy. Vascular: Cerebrovascular atherosclerotic calcifications are noted. Skull: Negative for fracture or focal lesion. Sinuses/Orbits: Visualized portions of the orbits are unremarkable. Visualized portions of the paranasal sinuses and mastoid air cells are unremarkable. Other: None. CT CERVICAL SPINE FINDINGS Alignment: Normal. Skull base and  vertebrae: No acute fracture. No primary bone lesion or focal pathologic process. Soft tissues and spinal canal: No prevertebral fluid or swelling. No visible canal hematoma. Disc levels: Degenerative disc disease with mild disc height loss at C5-6. At C2-3 there is left facet arthropathy. At C3-4 there is a broad-based disc osteophyte complex with bilateral uncovertebral degenerative changes and bilateral foraminal stenosis. At C4-5 there is a broad-based disc osteophyte complex with left uncovertebral degenerative changes and left facet arthropathy resulting in severe left foraminal stenosis. At C5-6 there is a broad-based disc osteophyte complex with right uncovertebral degenerative changes and severe right foraminal stenosis. Degenerative disc disease with disc height loss at T1-2. Upper chest: Lung apices are clear. Other: No fluid collection or hematoma. IMPRESSION: 1. No acute intracranial pathology. 2. No acute osseous injury of the cervical spine. 3. Cervical spine spondylosis as described above. Electronically Signed   By: Elige Ko   On: 12/01/2015 13:45   Ct Cervical Spine Wo Contrast  Result Date: 12/01/2015 CLINICAL DATA:  Syncope, fall EXAM: CT HEAD WITHOUT CONTRAST CT CERVICAL SPINE WITHOUT CONTRAST TECHNIQUE: Multidetector CT imaging of the head and cervical spine was performed following the standard protocol without intravenous contrast. Multiplanar CT image reconstructions of the cervical spine were also generated. COMPARISON:  02/03/2015 FINDINGS: CT HEAD FINDINGS Brain: No evidence of acute infarction, hemorrhage, extra-axial collection, ventriculomegaly, or mass effect. 4 mm colloid cyst in the third ventricle unchanged from the prior exam. Generalized cerebral atrophy. Periventricular white matter low attenuation likely secondary to microangiopathy. Vascular: Cerebrovascular atherosclerotic calcifications are noted. Skull: Negative for fracture or focal lesion. Sinuses/Orbits:  Visualized portions of the orbits are unremarkable. Visualized portions of the paranasal sinuses and mastoid air cells are unremarkable. Other: None. CT CERVICAL SPINE FINDINGS Alignment: Normal. Skull base and vertebrae: No acute fracture. No primary bone lesion or focal pathologic process. Soft tissues  and spinal canal: No prevertebral fluid or swelling. No visible canal hematoma. Disc levels: Degenerative disc disease with mild disc height loss at C5-6. At C2-3 there is left facet arthropathy. At C3-4 there is a broad-based disc osteophyte complex with bilateral uncovertebral degenerative changes and bilateral foraminal stenosis. At C4-5 there is a broad-based disc osteophyte complex with left uncovertebral degenerative changes and left facet arthropathy resulting in severe left foraminal stenosis. At C5-6 there is a broad-based disc osteophyte complex with right uncovertebral degenerative changes and severe right foraminal stenosis. Degenerative disc disease with disc height loss at T1-2. Upper chest: Lung apices are clear. Other: No fluid collection or hematoma. IMPRESSION: 1. No acute intracranial pathology. 2. No acute osseous injury of the cervical spine. 3. Cervical spine spondylosis as described above. Electronically Signed   By: Elige KoHetal  Patel   On: 12/01/2015 13:45    My personal review of EKG: Rhythm sinus bradycardia 49/min   Assessment & Plan:   1. Vertigo. Patient presented with vertigo-like symptoms. Will start the patient on meclizine. PT/OT evaluation, gentle IV fluids and monitor. Head and neck CT unremarkable.  2. AKI. Creatinine is elevated at 1.44. Continue on IV fluids hold HCTZ and ACE inhibitor along with his home dose NSAID and monitor.   3. CAD. S/p stent placement. Continue aspirin and brilinta. Beta blocker resume from tomorrow if heart rate better.  4. HTN. Pressures are elevated. Place him on Norvasc along with as needed hydralazine, if heart rate better from tomorrow  continue atenolol. Will hold  HCTZ and lisinopril due to renal function.    5. COPD. Compensated at this time. Continue Nebs and PRN o2.   6. Hepatitis C. No acute issue. Follow with ID/GI as an outpatient.   7. Bipolar disorder. Stable. Continue home medications.   8. Generalized headache. Due to persistent vertigo, no photophobia, supple neck, 1 dose of Motrin, when necessary Tylenol, avoid narcotics as narcotics withdrawal may worsen headaches, 1 dose of IV steroid and monitor.   9. Tobacco abuse. Counseled on cessation.    DVT Prophylaxis SCDs   AM Labs Ordered, also please review Full Orders  Family Communication: Admission, patients condition and plan of care including tests being ordered have been discussed with the patient who indicate understanding and agree with the plan and Code Status.  Code Status FULL   Likely DC to  Home   Condition GUARDED   Consults called: None   Admission status: Observation   Time spent in minutes : 35 minutes    Annett GulaHailei N Fulton M.D on 12/01/2015 at 2:05 PM  Between 7am to 7pm - Pager - [phone]. After 7pm go to [email or web address] - password [email or web address]  [facility] - Office  [phone]     By signing my name below, I, Cynda AcresHailei Fulton, attest that this documentation has been prepared under the direction and in the presence of Susa RaringPrashant, Katianne Barre , MD. Electronically signed: Cynda AcresHailei Fulton, Scribe. 12/01/15

## 2015-12-02 ENCOUNTER — Observation Stay (HOSPITAL_COMMUNITY): Payer: Medicaid - Out of State

## 2015-12-02 DIAGNOSIS — R42 Dizziness and giddiness: Secondary | ICD-10-CM | POA: Diagnosis not present

## 2015-12-02 LAB — CBC
HCT: 35.8 % — ABNORMAL LOW (ref 39.0–52.0)
HEMOGLOBIN: 12 g/dL — AB (ref 13.0–17.0)
MCH: 30.5 pg (ref 26.0–34.0)
MCHC: 33.5 g/dL (ref 30.0–36.0)
MCV: 91.1 fL (ref 78.0–100.0)
PLATELETS: 135 10*3/uL — AB (ref 150–400)
RBC: 3.93 MIL/uL — ABNORMAL LOW (ref 4.22–5.81)
RDW: 15.1 % (ref 11.5–15.5)
WBC: 4.4 10*3/uL (ref 4.0–10.5)

## 2015-12-02 LAB — BASIC METABOLIC PANEL
Anion gap: 6 (ref 5–15)
BUN: 14 mg/dL (ref 6–20)
CALCIUM: 8.7 mg/dL — AB (ref 8.9–10.3)
CO2: 20 mmol/L — AB (ref 22–32)
CREATININE: 1.1 mg/dL (ref 0.61–1.24)
Chloride: 111 mmol/L (ref 101–111)
GFR calc Af Amer: >60 mL/min (ref >60)
GLUCOSE: 167 mg/dL — AB (ref 65–99)
Potassium: 3.4 mmol/L — ABNORMAL LOW (ref 3.5–5.1)
Sodium: 137 mmol/L (ref 135–145)

## 2015-12-02 MED ORDER — KETOROLAC TROMETHAMINE 30 MG/ML IJ SOLN
30.0000 mg | Freq: Once | INTRAMUSCULAR | Status: AC
  Administered 2015-12-02: 30 mg via INTRAVENOUS
  Filled 2015-12-02: qty 1

## 2015-12-02 MED ORDER — IBUPROFEN 400 MG PO TABS
400.0000 mg | ORAL_TABLET | Freq: Four times a day (QID) | ORAL | Status: AC | PRN
  Administered 2015-12-02 – 2015-12-03 (×2): 400 mg via ORAL
  Filled 2015-12-02 (×2): qty 1

## 2015-12-02 MED ORDER — POTASSIUM CHLORIDE CRYS ER 20 MEQ PO TBCR
40.0000 meq | EXTENDED_RELEASE_TABLET | Freq: Once | ORAL | Status: AC
  Administered 2015-12-02: 40 meq via ORAL
  Filled 2015-12-02: qty 2

## 2015-12-02 MED ORDER — DIPHENHYDRAMINE HCL 50 MG/ML IJ SOLN
12.5000 mg | Freq: Once | INTRAMUSCULAR | Status: AC
  Administered 2015-12-02: 12.5 mg via INTRAVENOUS
  Filled 2015-12-02: qty 1

## 2015-12-02 MED ORDER — GADOBENATE DIMEGLUMINE 529 MG/ML IV SOLN
18.0000 mL | Freq: Once | INTRAVENOUS | Status: AC | PRN
  Administered 2015-12-02: 18 mL via INTRAVENOUS

## 2015-12-02 NOTE — Progress Notes (Signed)
Arlyss Queen. Smith notified that pt requesting something for persistent headache.  Tylenol is ordered, but concerned because of his history of hepatitis C.  Pt also stated that the tylenol did not help earlier today.  Will continue to monitor

## 2015-12-02 NOTE — Progress Notes (Addendum)
PROGRESS NOTE                                                                                                                                                                                                             Patient Demographics:    Casey Walker, is a 60 y.o. male, DOB - [birth year], ZOX:[id number]  Admit date - 12/01/2015   Admitting Physician Leroy Sea, MD  Outpatient Primary MD for the patient is Arlina Robes, MD  LOS - 0  Chief Complaint  Patient presents with  . Loss of Consciousness       Brief Narrative  Casey Walker  is a 60 y.o. 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 He has associated ringing in the ear, mild nausea, and a general headache. He denies photophobia and neck stiffness. Patient states the problem worsens with head turning and improves with rest and lying straight. While in the ED, head CT was negative. He was admitted for further evaluation of vertigo.    Subjective:    [facility] today has, No headache, No chest pain, No abdominal pain - No Nausea, No new weakness tingling or numbness, No Cough - SOB. Vertigo is better but still +ve.   Assessment  & Plan :     1. Vertigo. Patient presented with vertigo-like symptoms. better on meclizine, but still +ve. PT/OT evaluation, gentle IV fluids and monitor. Head and neck CT unremarkable. Will check MRI R/O posterior CVA.  2. AKI. Creatinine is elevated at 1.44. Continue on IV fluids hold HCTZ and ACE inhibitor , resolved.   3. CAD. S/p stent placement. Continue aspirin and brilinta. Beta blocker resume from tomorrow if heart rate better.  4. HTN. Pressures are elevated. Place him on Norvasc along with as needed hydralazine, if heart rate better from tomorrow continue atenolol. Will  hold  HCTZ and lisinopril due to renal function being poor on admission.    5. COPD. Compensated at this time. Continue Nebs and PRN o2.   6. Hepatitis C. No acute issue. Follow with ID/GI as an outpatient.   7. Bipolar disorder. Stable. Continue home medications.   8. Generalized headache. Says it is chronic, mildly worse due to persistent  vertigo + narcotic withdrawl, no photophobia, supple neck, 1 dose of Motrin, when necessary Tylenol & NSAIDs, avoid narcotics as narcotics withdrawal may worsen headaches, post 1 dose of IV steroid and monitor.   9. Tobacco abuse. Counseled on cessation.     Family Communication  :  None  Code Status :  Full  Diet : Diet Heart Room service appropriate? Yes; Fluid consistency: Thin    Disposition Plan  :  Home in am  Consults  :  None  Procedures  :    CT Head and Neck - non acute  MRI  DVT Prophylaxis  :   Heparin   Lab Results  Component Value Date   PLT 135 (L) 12/02/2015    Inpatient Medications  Scheduled Meds: . amLODipine  10 mg Oral Daily  . aspirin EC  81 mg Oral Daily  . atenolol  50 mg Oral BID  . clonazePAM  0.5 mg Oral TID  . finasteride  5 mg Oral Daily  . folic acid  1 mg Oral Daily  . heparin  5,000 Units Subcutaneous Q8H  . meclizine  25 mg Oral TID  . mirtazapine  30 mg Oral QHS  . OLANZapine  10 mg Oral QHS  . OLANZapine  2.5 mg Oral q morning - 10a  . oxybutynin  15 mg Oral QHS  . pantoprazole  40 mg Oral Daily  . pravastatin  40 mg Oral QHS  . pregabalin  100 mg Oral Daily  . pregabalin  300 mg Oral QHS  . risperiDONE  2 mg Oral QHS  . sertraline  100 mg Oral Daily  . tamsulosin  0.4 mg Oral QPM  . temazepam  15 mg Oral QHS  . ticagrelor  60 mg Oral BID  . traZODone  50 mg Oral QHS  . umeclidinium-vilanterol  1 puff Inhalation Daily  . vitamin B-12  100 mcg Oral Daily  . [START ON 12/04/2015] Vitamin D (Ergocalciferol)  50,000 Units Oral Q7 days   Continuous Infusions: . sodium chloride 75  mL/hr at 12/02/15 0418   PRN Meds:.acetaminophen, albuterol, gadobenate dimeglumine, hydrALAZINE, nitroGLYCERIN, ondansetron (ZOFRAN) IV, ondansetron **OR** [DISCONTINUED] ondansetron (ZOFRAN) IV  Antibiotics  :    Anti-infectives    None         Objective:   Vitals:   12/01/15 1530 12/01/15 1640 12/01/15 2100 12/02/15 0400  BP: 129/87 (!) 181/101 (!) 167/85 (!) 140/98  Pulse: (!) 59 70 62 84  Resp: 18 17 18 20   Temp:  97.5 F (36.4 C) 97.4 F (36.3 C) 98.4 F (36.9 C)  TempSrc:  Oral Oral Oral  SpO2: 97% 99% 97% 96%  Weight:  88.6 kg (195 lb 5.2 oz)  87.9 kg (193 lb 11.2 oz)  Height:        Wt Readings from Last 3 Encounters:  12/02/15 87.9 kg (193 lb 11.2 oz)  11/28/15 84.4 kg (186 lb)  11/13/15 84.6 kg (186 lb 6.4 oz)     Intake/Output Summary (Last 24 hours) at 12/02/15 1014 Last data filed at 12/02/15 0900  Gross per 24 hour  Intake             2630 ml  Output              800 ml  Net             1830 ml     Physical Exam  Awake Alert, Oriented X 3, No new F.N deficits, Normal affect  [place].AT,PERRAL, +ve horizontal nystagmus Supple Neck,No JVD, No cervical lymphadenopathy appriciated.  Symmetrical Chest wall movement, Good air movement bilaterally, CTAB RRR,No Gallops,Rubs or new Murmurs, No Parasternal Heave +ve B.Sounds, Abd Soft, No tenderness, No organomegaly appriciated, No rebound - guarding or rigidity. No Cyanosis, Clubbing or edema, No new Rash or bruise     Data Review:    CBC  Recent Labs Lab 12/01/15 1051 12/02/15 0640  WBC 3.3* 4.4  HGB 11.9* 12.0*  HCT 35.5* 35.8*  PLT 106* 135*  MCV 91.7 91.1  MCH 30.7 30.5  MCHC 33.5 33.5  RDW 14.9 15.1  LYMPHSABS 1.0  --   MONOABS 0.4  --   EOSABS 0.1  --   BASOSABS 0.0  --     Chemistries   Recent Labs Lab 12/01/15 1051 12/02/15 0640  NA 136 137  K 3.1* 3.4*  CL 106 111  CO2 24 20*  GLUCOSE 96 167*  BUN 19 14  CREATININE 1.44* 1.10  CALCIUM 8.9 8.7*  AST 22  --   ALT 16*   --   ALKPHOS 66  --   BILITOT 0.3  --    ------------------------------------------------------------------------------------------------------------------ No results for input(s): CHOL, HDL, LDLCALC, TRIG, CHOLHDL, LDLDIRECT in the last 72 hours.  Lab Results  Component Value Date   HGBA1C 5.6 11/11/2015   ------------------------------------------------------------------------------------------------------------------ No results for input(s): TSH, T4TOTAL, T3FREE, THYROIDAB in the last 72 hours.  Invalid input(s): FREET3 ------------------------------------------------------------------------------------------------------------------ No results for input(s): VITAMINB12, FOLATE, FERRITIN, TIBC, IRON, RETICCTPCT in the last 72 hours.  Coagulation profile No results for input(s): INR, PROTIME in the last 168 hours.   Recent Labs  12/01/15 1051  DDIMER 0.36    Cardiac Enzymes No results for input(s): CKMB, TROPONINI, MYOGLOBIN in the last 168 hours.  Invalid input(s): CK ------------------------------------------------------------------------------------------------------------------ No results found for: BNP  Micro Results No results found for this or any previous visit (from the past 240 hour(s)).  Radiology Reports Dg Chest 2 View  Result Date: 12/01/2015 CLINICAL DATA:  Multiple falls yesterday and today. EXAM: CHEST  2 VIEW COMPARISON:  02/03/2015. FINDINGS: Poor inspiration. Grossly normal sized heart. Minimal right basilar airspace opacity. No fracture pneumothorax. IMPRESSION: Poor inspiration with minimal right basilar atelectasis. Pneumonia is less likely. Electronically Signed   By: Beckie SaltsSteven  Reid M.D.   On: 12/01/2015 12:01   Ct Head Wo Contrast  Result Date: 12/01/2015 CLINICAL DATA:  Syncope, fall EXAM: CT HEAD WITHOUT CONTRAST CT CERVICAL SPINE WITHOUT CONTRAST TECHNIQUE: Multidetector CT imaging of the head and cervical spine was performed following the  standard protocol without intravenous contrast. Multiplanar CT image reconstructions of the cervical spine were also generated. COMPARISON:  02/03/2015 FINDINGS: CT HEAD FINDINGS Brain: No evidence of acute infarction, hemorrhage, extra-axial collection, ventriculomegaly, or mass effect. 4 mm colloid cyst in the third ventricle unchanged from the prior exam. Generalized cerebral atrophy. Periventricular white matter low attenuation likely secondary to microangiopathy. Vascular: Cerebrovascular atherosclerotic calcifications are noted. Skull: Negative for fracture or focal lesion. Sinuses/Orbits: Visualized portions of the orbits are unremarkable. Visualized portions of the paranasal sinuses and mastoid air cells are unremarkable. Other: None. CT CERVICAL SPINE FINDINGS Alignment: Normal. Skull base and vertebrae: No acute fracture. No primary bone lesion or focal pathologic process. Soft tissues and spinal canal: No prevertebral fluid or swelling. No visible canal hematoma. Disc levels: Degenerative disc disease with mild disc height loss at C5-6. At C2-3 there is left facet arthropathy. At C3-4 there is a broad-based disc osteophyte complex with  bilateral uncovertebral degenerative changes and bilateral foraminal stenosis. At C4-5 there is a broad-based disc osteophyte complex with left uncovertebral degenerative changes and left facet arthropathy resulting in severe left foraminal stenosis. At C5-6 there is a broad-based disc osteophyte complex with right uncovertebral degenerative changes and severe right foraminal stenosis. Degenerative disc disease with disc height loss at T1-2. Upper chest: Lung apices are clear. Other: No fluid collection or hematoma. IMPRESSION: 1. No acute intracranial pathology. 2. No acute osseous injury of the cervical spine. 3. Cervical spine spondylosis as described above. Electronically Signed   By: Elige Ko   On: 12/01/2015 13:45   Ct Cervical Spine Wo Contrast  Result Date:  12/01/2015 CLINICAL DATA:  Syncope, fall EXAM: CT HEAD WITHOUT CONTRAST CT CERVICAL SPINE WITHOUT CONTRAST TECHNIQUE: Multidetector CT imaging of the head and cervical spine was performed following the standard protocol without intravenous contrast. Multiplanar CT image reconstructions of the cervical spine were also generated. COMPARISON:  02/03/2015 FINDINGS: CT HEAD FINDINGS Brain: No evidence of acute infarction, hemorrhage, extra-axial collection, ventriculomegaly, or mass effect. 4 mm colloid cyst in the third ventricle unchanged from the prior exam. Generalized cerebral atrophy. Periventricular white matter low attenuation likely secondary to microangiopathy. Vascular: Cerebrovascular atherosclerotic calcifications are noted. Skull: Negative for fracture or focal lesion. Sinuses/Orbits: Visualized portions of the orbits are unremarkable. Visualized portions of the paranasal sinuses and mastoid air cells are unremarkable. Other: None. CT CERVICAL SPINE FINDINGS Alignment: Normal. Skull base and vertebrae: No acute fracture. No primary bone lesion or focal pathologic process. Soft tissues and spinal canal: No prevertebral fluid or swelling. No visible canal hematoma. Disc levels: Degenerative disc disease with mild disc height loss at C5-6. At C2-3 there is left facet arthropathy. At C3-4 there is a broad-based disc osteophyte complex with bilateral uncovertebral degenerative changes and bilateral foraminal stenosis. At C4-5 there is a broad-based disc osteophyte complex with left uncovertebral degenerative changes and left facet arthropathy resulting in severe left foraminal stenosis. At C5-6 there is a broad-based disc osteophyte complex with right uncovertebral degenerative changes and severe right foraminal stenosis. Degenerative disc disease with disc height loss at T1-2. Upper chest: Lung apices are clear. Other: No fluid collection or hematoma. IMPRESSION: 1. No acute intracranial pathology. 2. No acute  osseous injury of the cervical spine. 3. Cervical spine spondylosis as described above. Electronically Signed   By: Elige Ko   On: 12/01/2015 13:45   Ct Pelvis W Contrast  Result Date: 11/10/2015 CLINICAL DATA:  Boil  on the left buttock for 3 days with fever EXAM: CT PELVIS WITH CONTRAST TECHNIQUE: Multidetector CT imaging of the pelvis was performed using the standard protocol following the bolus administration of intravenous contrast. CONTRAST:  ISOVUE-300 IOPAMIDOL (ISOVUE-300) INJECTION 61% COMPARISON:  None. FINDINGS: Urinary Tract: Urinary bladder is dilated. Bladder otherwise unremarkable. No distal ureteral dilatation. Bowel: Partially visualized appendix within normal limits. Multiple sigmoid colon diverticula without focal wall thickening. Vascular/Lymphatic: Atherosclerotic calcifications in the aorta. No aneurysm. There are several enlarged left pelvic sidewall lymph nodes, measuring up to 8 mm in short axis. Enlarged left external iliac lymph node measuring 1.1 cm. Reproductive:  No mass or other significant abnormality Other:  No free fluid. Musculoskeletal: There is focal skin thickening of the left gluteal region. Moderate edema and soft tissue stranding is present. There is a more focal soft tissue density measuring 3.3 by 1.9 cm within the subcutaneous fat of the left parasagittal gluteal soft tissues which may represent a phlegmon.  There is no well-formed abscess or rim enhancing fluid collection at this time. IMPRESSION: 1. Left gluteal region soft tissue thickening with moderate edema and subcutaneous soft tissue stranding. Focal 3.3 cm soft tissue density within the subcutaneous of the medial left gluteal tissues suspicious for a phlegmon. There is no well-formed abscess or rim enhancing fluid collection at this time. 2. 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 3. Enlarged left pelvic sidewall and iliac nodes, likely reactive. Electronically Signed   By: Jasmine Pang M.D.   On: 11/10/2015 19:09    Time Spent in minutes  30   Perle Gibbon K M.D on 12/02/2015 at 10:14 AM  Between 7am to 7pm - Pager - [phone]  After 7pm go to [email or web address] - password [facility]  [facility] -  Office  [phone]

## 2015-12-02 NOTE — Progress Notes (Signed)
10 mg IV hydralazine given PRN for high BP

## 2015-12-03 DIAGNOSIS — R42 Dizziness and giddiness: Secondary | ICD-10-CM | POA: Diagnosis not present

## 2015-12-03 LAB — POTASSIUM: Potassium: 2.9 mmol/L — ABNORMAL LOW (ref 3.5–5.1)

## 2015-12-03 MED ORDER — MAGNESIUM SULFATE IN D5W 1-5 GM/100ML-% IV SOLN
1.0000 g | Freq: Once | INTRAVENOUS | Status: AC
  Administered 2015-12-03: 1 g via INTRAVENOUS
  Filled 2015-12-03: qty 100

## 2015-12-03 MED ORDER — AMLODIPINE BESYLATE 10 MG PO TABS: 10.0000 mg | ORAL_TABLET | Freq: Every day | ORAL | 0 refills | Status: AC

## 2015-12-03 MED ORDER — POTASSIUM CHLORIDE 10 MEQ/100ML IV SOLN
10.0000 meq | INTRAVENOUS | Status: AC
  Administered 2015-12-03 (×2): 10 meq via INTRAVENOUS

## 2015-12-03 MED ORDER — IBUPROFEN 600 MG PO TABS
600.0000 mg | ORAL_TABLET | Freq: Once | ORAL | Status: AC
  Administered 2015-12-03: 600 mg via ORAL
  Filled 2015-12-03: qty 1

## 2015-12-03 MED ORDER — POTASSIUM CHLORIDE CRYS ER 20 MEQ PO TBCR
40.0000 meq | EXTENDED_RELEASE_TABLET | Freq: Four times a day (QID) | ORAL | Status: DC
  Administered 2015-12-03: 40 meq via ORAL
  Filled 2015-12-03: qty 2

## 2015-12-03 MED ORDER — MECLIZINE HCL 25 MG PO TABS: 25.0000 mg | ORAL_TABLET | Freq: Three times a day (TID) | ORAL | 0 refills | Status: AC | PRN

## 2015-12-03 NOTE — Discharge Instructions (Signed)
Do not drive until you have vertigo has completely cleared and you have been cleared to drive by your family physician.   Follow with Primary MD Casey Walker,[name], MD in 7 days   Get CBC, CMP, 2 view Chest X ray checked  by Primary MD or SNF MD in 5-7 days ( we routinely change or add medications that can affect your baseline labs and fluid status, therefore we recommend that you get the mentioned basic workup next visit with your PCP, your PCP may decide not to get them or add new tests based on their clinical decision)   Activity: As tolerated with Full fall precautions use walker/cane & assistance as needed   Disposition Home     Diet:  Heart Healthy    For Heart failure patients - Check your Weight same time everyday, if you gain over 2 pounds, or you develop in leg swelling, experience more shortness of breath or chest pain, call your Primary MD immediately. Follow Cardiac Low Salt Diet and 1.5 lit/day fluid restriction.   On your next visit with your primary care physician please Get Medicines reviewed and adjusted.   Please request your Prim.MD to go over all Hospital Tests and Procedure/Radiological results at the follow up, please get all Hospital records sent to your Prim MD by signing hospital release before you go home.   If you experience worsening of your admission symptoms, develop shortness of breath, life threatening emergency, suicidal or homicidal thoughts you must seek medical attention immediately by calling 911 or calling your MD immediately  if symptoms less severe.  You Must read complete instructions/literature along with all the possible adverse reactions/side effects for all the Medicines you take and that have been prescribed to you. Take any new Medicines after you have completely understood and accpet all the possible adverse reactions/side effects.   Do not drive, operate heavy machinery, perform activities at heights, swimming or participation in water  activities or provide baby sitting services if your were admitted for syncope or siezures until you have seen by Primary MD or a Neurologist and advised to do so again.  Do not drive when taking Pain medications.    Do not take more than prescribed Pain, Sleep and Anxiety Medications  Special Instructions: If you have smoked or chewed Tobacco  in the last 2 yrs please stop smoking, stop any regular Alcohol  and or any Recreational drug use.  Wear Seat belts while driving.   Please note  You were cared for by a hospitalist during your hospital stay. If you have any questions about your discharge medications or the care you received while you were in the hospital after you are discharged, you can call the unit and asked to speak with the hospitalist on call if the hospitalist that took care of you is not available. Once you are discharged, your primary care physician will handle any further medical issues. Please note that NO REFILLS for any discharge medications will be authorized once you are discharged, as it is imperative that you return to your primary care physician (or establish a relationship with a primary care physician if you do not have one) for your aftercare needs so that they can reassess your need for medications and monitor your lab values.

## 2015-12-03 NOTE — Discharge Summary (Addendum)
Casey Walker WUJ:[id number] DOB: 04-10-1955 DOA: 12/01/2015  PCP: Casey Robes, Walker  Admit date: 12/01/2015  Discharge date: 12/03/2015  Admitted From: Home   Disposition:  Home   Recommendations for Outpatient Follow-up:   Follow up with PCP in 1-2 weeks  PCP Please obtain BMP/CBC, 2 view CXR in 1week,  (see Discharge instructions)   PCP Please follow up on the following pending results: Check BMP, magnesium and CBC in 2-3 days.   Home Health: PT, OT and aide Equipment/Devices: Rolling walker  Consultations: None Discharge Condition: Stable CODE STATUS: Full   Diet Recommendation:  Heart Healthy    Chief Complaint  Patient presents with  . 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 He has associated ringing in the ear, mild nausea, and a general headache. He denies photophobia and neck stiffness. Patient states the problem worsens with head turning and improves with rest and lying straight. While in the ED, head CT was negative. He was admitted for further evaluation of vertigo.   Hospital issues addressed     1. Vertigo. Patient presented with vertigo-like symptoms. better on meclizine, Head CT and MRI brain were unremarkable, on meclizine he is much better, ambulated in the hallway with me for over 30 yards without any discomfort, home PT/OT and walker have been ordered along with when necessary meclizine, requested not to drive till vertigo completely clears and he has been cleared by his primary care physician to  do so.  2. AKI. Creatinine is elevated at 1.44. Resolved with IV fluids and holding of HCTZ and ACE, upon discharge will replace ACE with Norvasc, PCP to monitor renal function .   3. CAD. S/p stent placement. Continue aspirin and brilinta.Beta blocker resume from tomorrow if heart rate better.  4. HTN. Pressures are elevated. Placed him on Norvasc and tinea home dose beta blocker and HCTZ, PCP to continue monitoring blood pressure.   5. COPD. Compensated at this time. Continue Nebs and PRN o2.   6. Hepatitis C. No acute issue. Follow with ID/GI as an outpatient.   7. Bipolar disorder. Stable. Continue home medications.   8. Generalized headache. Says it is chronic, he also claims that he takes narcotics for chronic headaches, in my opinion he is getting narcotic withdrawal headaches, he is already on NSAIDs at home which will be continued as needed, have discontinued his home dose narcotic, requests PCP to review his narcotic intake. 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  9. Tobacco abuse. Counseled on cessation.  10. Hypokalemia. Will be replaced prior to discharge requested to follow with PCP in 2 days for a repeat BMP.   Discharge diagnosis     Principal Problem:  Vertigo Active Problems:   Arteriosclerosis of coronary artery   HCV (hepatitis C virus)   Essential hypertension   Current tobacco use   Major depression    Discharge instructions    Discharge Instructions    Diet - low sodium heart healthy    Complete by:  As directed    Discharge instructions    Complete by:  As directed    Do not drive until you have vertigo has completely cleared and you have been cleared to drive by your family physician.   Follow with Primary Walker Casey Walker in 7 days   Get CBC, CMP, 2 view Chest X ray checked  by Primary Walker or SNF Walker in 5-7 days ( we routinely change or add medications  that can affect your baseline labs and fluid status, therefore we recommend that you get the mentioned basic workup next visit with your PCP, your PCP may decide not to get them or add new tests based on their clinical decision)   Activity: As tolerated with Full fall precautions use walker/cane & assistance as needed   Disposition Home     Diet:  Heart Healthy    For Heart failure patients - Check your Weight same time everyday, if you gain over 2 pounds, or you develop in leg swelling, experience more shortness of breath or chest pain, call your Primary Walker immediately. Follow Cardiac Low Salt Diet and 1.5 lit/day fluid restriction.   On your next visit with your primary care physician please Get Medicines reviewed and adjusted.   Please request your Prim.Walker to go over all Hospital Tests and Procedure/Radiological results at the follow up, please get all Hospital records sent to your Prim Walker by signing hospital release before you go home.   If you experience worsening of your admission symptoms, develop shortness of breath, life threatening emergency, suicidal or homicidal thoughts you must seek medical attention immediately by calling 911 or calling your Walker immediately  if symptoms less severe.  You Must read complete instructions/literature along with all the possible adverse reactions/side effects for all the Medicines you take and that have been prescribed to you. Take any new Medicines after you have completely understood and accpet all the possible adverse reactions/side effects.   Do not drive, operate heavy machinery, perform activities at heights, swimming or participation in water activities or provide baby sitting services if your were admitted for syncope or siezures until you have seen by Primary Walker or a Neurologist and advised to do so again.  Do not drive when taking Pain medications.    Do not take more than prescribed Pain, Sleep and Anxiety Medications  Special  Instructions: If you have smoked or chewed Tobacco  in the last 2 yrs please stop smoking, stop any regular Alcohol  and or any Recreational drug use.  Wear Seat belts while driving.   Please note  You were cared for by a hospitalist during your hospital stay. If you have any questions about your discharge medications or the care you received while you were in the hospital after you are discharged, you can call the unit and asked to speak with the hospitalist on call if the hospitalist that took care of you is not available. Once you are discharged, your primary care physician will handle any further medical issues. Please note that NO REFILLS for any discharge medications will be authorized once you are discharged, as it is imperative that you return to your primary care  physician (or establish a relationship with a primary care physician if you do not have one) for your aftercare needs so that they can reassess your need for medications and monitor your lab values.   Increase activity slowly    Complete by:  As directed       Discharge Medications     Medication List    STOP taking these medications   doxycycline 100 MG tablet Commonly known as:  VIBRA-TABS   HYDROcodone-acetaminophen 5-325 MG tablet Commonly known as:  NORCO/VICODIN   lisinopril 20 MG tablet Commonly known as:  PRINIVIL,ZESTRIL     TAKE these medications   amLODipine 10 MG tablet Commonly known as:  NORVASC Take 1 tablet (10 mg total) by mouth daily.   ANORO ELLIPTA 62.5-25 MCG/INH Aepb Generic drug:  umeclidinium-vilanterol Inhale 1 puff into the lungs daily.   aspirin EC 81 MG tablet Take 81 mg by mouth daily.   atenolol 50 MG tablet Commonly known as:  TENORMIN Take 50 mg by mouth 2 (two) times daily.   BRILINTA 60 MG Tabs tablet Generic drug:  ticagrelor Take 60 mg by mouth 2 (two) times daily.   clonazePAM 0.5 MG tablet Commonly known as:  KLONOPIN Take 0.5 mg by mouth 3 (three) times  daily.   diclofenac 50 MG EC tablet Commonly known as:  VOLTAREN Take 1 tablet (50 mg total) by mouth 2 (two) times daily.   finasteride 5 MG tablet Commonly known as:  PROSCAR Take 5 mg by mouth daily.   folic acid 1 MG tablet Commonly known as:  FOLVITE Take 1 mg by mouth daily.   hydrochlorothiazide 25 MG tablet Commonly known as:  HYDRODIURIL Take 25 mg by mouth daily.   LYRICA 100 MG capsule Generic drug:  pregabalin Take 100-300 mg by mouth 2 (two) times daily. 1 capsule in the morning and 3 capsules at bedtime   meclizine 25 MG tablet Commonly known as:  ANTIVERT Take 1 tablet (25 mg total) by mouth 3 (three) times daily as needed for dizziness.   mirtazapine 30 MG tablet Commonly known as:  REMERON Take 30 mg by mouth at bedtime.   nitroGLYCERIN 0.4 MG SL tablet Commonly known as:  NITROSTAT take one tablet by mouth as directed/as needed for chest pain. Take one tablet every 5 minutes. If no relief after 3 doses, call 911   OLANZapine 10 MG tablet Commonly known as:  ZYPREXA Take 10 mg by mouth at bedtime.   OLANZapine 2.5 MG tablet Commonly known as:  ZYPREXA Take 2.5 mg by mouth every morning.   Omeprazole 20 MG Tbec Take 20 mg by mouth daily.   oxybutynin 15 MG 24 hr tablet Commonly known as:  DITROPAN XL Take 15 mg by mouth at bedtime.   pravastatin 40 MG tablet Commonly known as:  PRAVACHOL Take 40 mg by mouth at bedtime.   PROAIR HFA 108 (90 Base) MCG/ACT inhaler Generic drug:  albuterol Inhale 2 puffs into the lungs every 6 (six) hours as needed for wheezing or shortness of breath. Shortness of breath and wheezing   risperiDONE 2 MG tablet Commonly known as:  RISPERDAL Take 2 mg by mouth at bedtime.   sertraline 100 MG tablet Commonly known as:  ZOLOFT Take 100 mg by mouth daily.   SPIRIVA HANDIHALER 18 MCG inhalation capsule Generic drug:  tiotropium Place 1 puff into inhaler and inhale daily.   tamsulosin 0.4 MG Caps  capsule Commonly known as:  FLOMAX Take 0.4  mg by mouth every evening.   temazepam 15 MG capsule Commonly known as:  RESTORIL Take 15 mg by mouth at bedtime.   traZODone 100 MG tablet Commonly known as:  DESYREL Take 50 mg by mouth at bedtime.   vitamin B-12 100 MCG tablet Commonly known as:  CYANOCOBALAMIN Take 100 mcg by mouth daily.   Vitamin D (Ergocalciferol) 50000 units Caps capsule Commonly known as:  DRISDOL Take 50,000 Units by mouth every 7 (seven) days.            Durable Medical Equipment        Start     Ordered   12/03/15 [phone]  For home use only DME 4 wheeled rolling walker with seat  Once    Question:  Patient needs a walker to treat with the following condition  Answer:  Vertigo   12/03/15 0838      Follow-up Information    Casey Robes, Walker. Schedule an appointment as soon as possible for a visit in 2 day(s).   Specialty:  Family Medicine          Major procedures and Radiology Reports - PLEASE review detailed and final reports thoroughly  -         Dg Chest 2 View  Result Date: 12/01/2015 CLINICAL DATA:  Multiple falls yesterday and today. EXAM: CHEST  2 VIEW COMPARISON:  02/03/2015. FINDINGS: Poor inspiration. Grossly normal sized heart. Minimal right basilar airspace opacity. No fracture pneumothorax. IMPRESSION: Poor inspiration with minimal right basilar atelectasis. Pneumonia is less likely. Electronically Signed   By: Beckie Salts M.D.   On: 12/01/2015 12:01   Ct Head Wo Contrast  Result Date: 12/01/2015 CLINICAL DATA:  Syncope, fall EXAM: CT HEAD WITHOUT CONTRAST CT CERVICAL SPINE WITHOUT CONTRAST TECHNIQUE: Multidetector CT imaging of the head and cervical spine was performed following the standard protocol without intravenous contrast. Multiplanar CT image reconstructions of the cervical spine were also generated. COMPARISON:  02/03/2015 FINDINGS: CT HEAD FINDINGS Brain: No evidence of acute infarction, hemorrhage,  extra-axial collection, ventriculomegaly, or mass effect. 4 mm colloid cyst in the third ventricle unchanged from the prior exam. Generalized cerebral atrophy. Periventricular white matter low attenuation likely secondary to microangiopathy. Vascular: Cerebrovascular atherosclerotic calcifications are noted. Skull: Negative for fracture or focal lesion. Sinuses/Orbits: Visualized portions of the orbits are unremarkable. Visualized portions of the paranasal sinuses and mastoid air cells are unremarkable. Other: None. CT CERVICAL SPINE FINDINGS Alignment: Normal. Skull base and vertebrae: No acute fracture. No primary bone lesion or focal pathologic process. Soft tissues and spinal canal: No prevertebral fluid or swelling. No visible canal hematoma. Disc levels: Degenerative disc disease with mild disc height loss at C5-6. At C2-3 there is left facet arthropathy. At C3-4 there is a broad-based disc osteophyte complex with bilateral uncovertebral degenerative changes and bilateral foraminal stenosis. At C4-5 there is a broad-based disc osteophyte complex with left uncovertebral degenerative changes and left facet arthropathy resulting in severe left foraminal stenosis. At C5-6 there is a broad-based disc osteophyte complex with right uncovertebral degenerative changes and severe right foraminal stenosis. Degenerative disc disease with disc height loss at T1-2. Upper chest: Lung apices are clear. Other: No fluid collection or hematoma. IMPRESSION: 1. No acute intracranial pathology. 2. No acute osseous injury of the cervical spine. 3. Cervical spine spondylosis as described above. Electronically Signed   By: Elige Ko   On: 12/01/2015 13:45   Ct Cervical Spine Wo Contrast  Result Date: 12/01/2015 CLINICAL DATA:  Syncope, fall EXAM: CT HEAD WITHOUT CONTRAST CT CERVICAL SPINE WITHOUT CONTRAST TECHNIQUE: Multidetector CT imaging of the head and cervical spine was performed following the standard protocol without  intravenous contrast. Multiplanar CT image reconstructions of the cervical spine were also generated. COMPARISON:  02/03/2015 FINDINGS: CT HEAD FINDINGS Brain: No evidence of acute infarction, hemorrhage, extra-axial collection, ventriculomegaly, or mass effect. 4 mm colloid cyst in the third ventricle unchanged from the prior exam. Generalized cerebral atrophy. Periventricular white matter low attenuation likely secondary to microangiopathy. Vascular: Cerebrovascular atherosclerotic calcifications are noted. Skull: Negative for fracture or focal lesion. Sinuses/Orbits: Visualized portions of the orbits are unremarkable. Visualized portions of the paranasal sinuses and mastoid air cells are unremarkable. Other: None. CT CERVICAL SPINE FINDINGS Alignment: Normal. Skull base and vertebrae: No acute fracture. No primary bone lesion or focal pathologic process. Soft tissues and spinal canal: No prevertebral fluid or swelling. No visible canal hematoma. Disc levels: Degenerative disc disease with mild disc height loss at C5-6. At C2-3 there is left facet arthropathy. At C3-4 there is a broad-based disc osteophyte complex with bilateral uncovertebral degenerative changes and bilateral foraminal stenosis. At C4-5 there is a broad-based disc osteophyte complex with left uncovertebral degenerative changes and left facet arthropathy resulting in severe left foraminal stenosis. At C5-6 there is a broad-based disc osteophyte complex with right uncovertebral degenerative changes and severe right foraminal stenosis. Degenerative disc disease with disc height loss at T1-2. Upper chest: Lung apices are clear. Other: No fluid collection or hematoma. IMPRESSION: 1. No acute intracranial pathology. 2. No acute osseous injury of the cervical spine. 3. Cervical spine spondylosis as described above. Electronically Signed   By: Elige KoHetal  Patel   On: 12/01/2015 13:45   Ct Pelvis W Contrast  Result Date: 11/10/2015 CLINICAL DATA:  Boil   on the left buttock for 3 days with fever EXAM: CT PELVIS WITH CONTRAST TECHNIQUE: Multidetector CT imaging of the pelvis was performed using the standard protocol following the bolus administration of intravenous contrast. CONTRAST:  100mL ISOVUE-300 IOPAMIDOL (ISOVUE-300) INJECTION 61% COMPARISON:  None. FINDINGS: Urinary Tract: Urinary bladder is dilated. Bladder otherwise unremarkable. No distal ureteral dilatation. Bowel: Partially visualized appendix within normal limits. Multiple sigmoid colon diverticula without focal wall thickening. Vascular/Lymphatic: Atherosclerotic calcifications in the aorta. No aneurysm. There are several enlarged left pelvic sidewall lymph nodes, measuring up to 8 mm in short axis. Enlarged left external iliac lymph node measuring 1.1 cm. Reproductive:  No mass or other significant abnormality Other:  No free fluid. Musculoskeletal: There is focal skin thickening of the left gluteal region. Moderate edema and soft tissue stranding is present. There is a more focal soft tissue density measuring 3.3 by 1.9 cm within the subcutaneous fat of the left parasagittal gluteal soft tissues which may represent a phlegmon. There is no well-formed abscess or rim enhancing fluid collection at this time. IMPRESSION: 1. Left gluteal region soft tissue thickening with moderate edema and subcutaneous soft tissue stranding. Focal 3.3 cm soft tissue density within the subcutaneous of the medial left gluteal tissues suspicious for a phlegmon. There is no well-formed abscess or rim enhancing fluid collection at this time. 2. 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 3. Enlarged left pelvic sidewall and iliac nodes, likely reactive. Electronically Signed   By: Jasmine PangKim  Fujinaga M.D.   On: 11/10/2015 19:09   Mr Laqueta JeanBrain W UJWo Contrast  Result Date: 12/02/2015 CLINICAL DATA:  Vertigo and ringing in the ears. EXAM: MRI HEAD WITHOUT AND WITH CONTRAST TECHNIQUE: Multiplanar,  multiecho pulse  sequences of the brain and surrounding structures were obtained without and with intravenous contrast. CONTRAST:  18mL MULTIHANCE GADOBENATE DIMEGLUMINE 529 MG/ML IV SOLN COMPARISON:  None. FINDINGS: Brain: No acute infarction, hemorrhage, hydrocephalus, extra-axial collection or mass lesion. Rare FLAIR hyperintensities in the cerebral white matter are nonspecific but likely from mild microvascular ischemic injury in this patient with multiple vascular risk factors. Symmetric normal labyrinthine signal. Normal appearance of the vestibular cochlear nerves. No abnormal temporal bone enhancement. No mastoid or middle ear fluid. Normal appearance of the brainstem and cisterns. Vascular: Normal flow voids Skull and upper cervical spine: Heterogeneous marrow in the cervical spine without focal hypo intense mass. Sinuses/Orbits: Negative IMPRESSION: No acute finding or explanation for symptoms. Electronically Signed   By: Marnee Spring M.D.   On: 12/02/2015 10:54    Micro Results     No results found for this or any previous visit (from the past 240 hour(s)).  Today   Subjective    Casey Walker today has no headache,no chest abdominal pain,no new weakness tingling or numbness, feels much better wants to go home today.     Objective   Blood pressure (!) 165/89, pulse (!) 55, temperature 97.5 F (36.4 C), temperature source Oral, resp. rate 18, height 5\' 8"  (1.727 m), weight 87 kg (191 lb 12.8 oz), SpO2 97 %.   Intake/Output Summary (Last 24 hours) at 12/03/15 0845 Last data filed at 12/03/15 0604  Gross per 24 hour  Intake              720 ml  Output             4350 ml  Net            -3630 ml    Exam Awake Alert, Oriented x 3, No new F.N deficits, Normal affect [place].AT,PERRAL Supple Neck,No JVD, No cervical lymphadenopathy appriciated.  Symmetrical Chest wall movement, Good air movement bilaterally, CTAB RRR,No Gallops,Rubs or new Murmurs, No Parasternal Heave +ve B.Sounds, Abd Soft,  Non tender, No organomegaly appriciated, No rebound -guarding or rigidity. No Cyanosis, Clubbing or edema, No new Rash or bruise   Data Review   CBC w Diff:  Lab Results  Component Value Date   WBC 4.4 12/02/2015   HGB 12.0 (L) 12/02/2015   HCT 35.8 (L) 12/02/2015   PLT 135 (L) 12/02/2015   LYMPHOPCT 30 12/01/2015   MONOPCT 12 12/01/2015   EOSPCT 2 12/01/2015   BASOPCT 0 12/01/2015    CMP:  Lab Results  Component Value Date   NA 137 12/02/2015   K 2.9 (L) 12/03/2015   CL 111 12/02/2015   CO2 20 (L) 12/02/2015   BUN 14 12/02/2015   CREATININE 1.10 12/02/2015   PROT 7.2 12/01/2015   ALBUMIN 3.4 (L) 12/01/2015   BILITOT 0.3 12/01/2015   ALKPHOS 66 12/01/2015   AST 22 12/01/2015   ALT 16 (L) 12/01/2015  .   Total Time in preparing paper work, data evaluation and todays exam - 35 minutes  Leroy Sea M.D on 12/03/2015 at 8:45 AM  [facility]   Office  [phone]

## 2015-12-03 NOTE — Discharge Planning (Signed)
Pt discharged home, vitals stable, no c/o pain, discharge paperwork sent with patient, no questions for the nurse 

## 2015-12-11 NOTE — ED Provider Notes (Signed)
[facility]-EMERGENCY DEPT Provider Note   CSN: [id number] Arrival date & time: 11/28/15  0944     History   Chief Complaint Chief Complaint  Patient presents with  . Shoulder Pain    HPI Casey Walker is a 60 y.o. male.  HPI   60 year old male with left shoulder pain. Ongoing pain for several months. Worse with the last few days. Denies any acute injury or strain. Mild pain at rest which is sniffily exacerbated by movements, particularly on his left arm is raised above his head. Is requesting pain medications. States that he is unable to follow-up with his PCP for another several weeks. Denies any acute numbness or tingling. Denies any acute chest pain, dyspnea or back pain.   Past Medical History:  Diagnosis Date  . CAD (coronary artery disease)    [place], [place]  . 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 COPD (chronic obstructive pulmonary disease) (HCC)   . Hepatitis C   . Hypertension   . Kidney disorder    due to hepatitis per patient    Patient Active Problem List   Diagnosis Date Noted  . Vertigo 12/01/2015  . Cellulitis 11/10/2015  . Hyperglycemia 11/10/2015  . Syncope 02/03/2015  . Major depression 02/03/2015  . 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 Kidney disorder   . HCV (hepatitis C virus) 11/08/2010  . Arteriosclerosis of coronary artery 10/31/2010  . Essential hypertension 10/31/2010  . Seizure (HCC) 10/31/2010  . Current tobacco use 10/31/2010    Past Surgical History:  Procedure Laterality Date  . CORONARY STENT PLACEMENT    . ELBOW SURGERY    . TRANSURETHRAL RESECTION OF PROSTATE         Home Medications    Prior to Admission medications   Medication Sig Start Date End Date Taking? Authorizing Provider  albuterol (PROAIR HFA) 108 (90 Base) MCG/ACT inhaler Inhale 2 puffs into the lungs every 6 (six) hours as needed for wheezing or shortness of breath. Shortness of breath and wheezing 06/21/12   Historical Provider, MD    amLODipine (NORVASC) 10 MG tablet Take 1 tablet (10 mg total) by mouth daily. 12/03/15   Leroy Sea, MD  ANORO ELLIPTA 62.5-25 MCG/INH AEPB Inhale 1 puff into the lungs daily. 12/16/14   Historical Provider, MD  aspirin EC 81 MG tablet Take 81 mg by mouth daily.    Historical Provider, MD  atenolol (TENORMIN) 50 MG tablet Take 50 mg by mouth 2 (two) times daily.  05/01/12   Historical Provider, MD  BRILINTA 60 MG TABS tablet Take 60 mg by mouth 2 (two) times daily. 10/17/15   Historical Provider, MD  clonazePAM (KLONOPIN) 0.5 MG tablet Take 0.5 mg by mouth 3 (three) times daily. 01/09/15   Historical Provider, MD  diclofenac (VOLTAREN) 50 MG EC tablet Take 1 tablet (50 mg total) by mouth 2 (two) times daily. 11/07/15   Elson Areas, PA-C  finasteride (PROSCAR) 5 MG tablet Take 5 mg by mouth daily. 12/26/14   Historical Provider, MD  folic acid (FOLVITE) 1 MG tablet Take 1 mg by mouth daily. 12/26/14   Historical Provider, MD  hydrochlorothiazide (HYDRODIURIL) 25 MG tablet Take 25 mg by mouth daily. 05/01/12   Historical Provider, MD  LYRICA 100 MG capsule Take 100-300 mg by mouth 2 (two) times daily. 1 capsule in the morning and 3 capsules at bedtime 01/09/15   Historical Provider, MD  meclizine (ANTIVERT) 25 MG tablet Take 1 tablet (  25 mg total) by mouth 3 (three) times daily as needed for dizziness. 12/03/15   Leroy SeaPrashant K Singh, MD  mirtazapine (REMERON) 30 MG tablet Take 30 mg by mouth at bedtime. 12/26/14   Historical Provider, MD  nitroGLYCERIN (NITROSTAT) 0.4 MG SL tablet take one tablet by mouth as directed/as needed for chest pain. Take one tablet every 5 minutes. If no relief after 3 doses, call 911 10/13/15   Historical Provider, MD  OLANZapine (ZYPREXA) 10 MG tablet Take 10 mg by mouth at bedtime. 10/02/15   Historical Provider, MD  OLANZapine (ZYPREXA) 2.5 MG tablet Take 2.5 mg by mouth every morning. 10/02/15   Historical Provider, MD  Omeprazole 20 MG TBEC Take 20 mg by mouth daily.  12/23/14   Historical Provider, MD  oxybutynin (DITROPAN XL) 15 MG 24 hr tablet Take 15 mg by mouth at bedtime.    Historical Provider, MD  pravastatin (PRAVACHOL) 40 MG tablet Take 40 mg by mouth at bedtime. 12/26/14   Historical Provider, MD  risperiDONE (RISPERDAL) 2 MG tablet Take 2 mg by mouth at bedtime. 05/01/12   Historical Provider, MD  sertraline (ZOLOFT) 100 MG tablet Take 100 mg by mouth daily. 12/29/14   Historical Provider, MD  SPIRIVA HANDIHALER 18 MCG inhalation capsule Place 1 puff into inhaler and inhale daily. 11/20/14   Historical Provider, MD  tamsulosin (FLOMAX) 0.4 MG CAPS capsule Take 0.4 mg by mouth every evening. 12/19/14   Historical Provider, MD  temazepam (RESTORIL) 15 MG capsule Take 15 mg by mouth at bedtime. 09/28/15   Historical Provider, MD  traZODone (DESYREL) 100 MG tablet Take 50 mg by mouth at bedtime.  01/11/15   Historical Provider, MD  vitamin B-12 (CYANOCOBALAMIN) 100 MCG tablet Take 100 mcg by mouth daily.    Historical Provider, MD  Vitamin D, Ergocalciferol, (DRISDOL) 50000 units CAPS capsule Take 50,000 Units by mouth every 7 (seven) days.    Historical Provider, MD    Family History Family History  Problem Relation Age of Onset  . Hypertension Mother     Social History Social History  Substance Use Topics  . Smoking status: Current Every Day Smoker    Packs/day: 0.25  . Smokeless tobacco: Former NeurosurgeonUser  . Alcohol use No     Allergies   Penicillins   Review of Systems Review of Systems  All systems reviewed and negative, other than as noted in HPI.  Physical Exam Updated Vital Signs BP 113/81   Pulse 64   Temp 97.6 F (36.4 C) (Oral)   Resp 20   Ht 5\' 8"  (1.727 m)   Wt 186 lb (84.4 kg)   SpO2 96%   BMI 28.28 kg/m   Physical Exam  Constitutional: He appears well-developed and well-nourished. No distress.  HENT:  Head: Normocephalic and atraumatic.  Eyes: Conjunctivae are normal. Right eye exhibits no discharge. Left eye  exhibits no discharge.  Neck: Neck supple.  Cardiovascular: Normal rate, regular rhythm and normal heart sounds.  Exam reveals no gallop and no friction rub.   No murmur heard. Pulmonary/Chest: Effort normal and breath sounds normal. No respiratory distress.  Abdominal: Soft. He exhibits no distension. There is no tenderness.  Musculoskeletal: He exhibits no edema or tenderness.  Left shoulder is grossly normal appearance and symmetric as compared to the right. There is no focal point tenderness. He does have a positive Hawkins-Kennedy test and positive Neer's test. Neurovascularly intact.  Neurological: He is alert.  Skin: Skin is warm and dry.  Psychiatric: He has a normal mood and affect. His behavior is normal. Thought content normal.  Nursing note and vitals reviewed.    ED Treatments / Results  Labs (all labs ordered are listed, but only abnormal results are displayed) Labs Reviewed - No data to display  EKG  EKG Interpretation None       Radiology No results found.  Procedures Procedures (including critical care time)  Medications Ordered in ED Medications - No data to display   Initial Impression / Assessment and Plan / ED Course  I have reviewed the triage vital signs and the nursing notes.  Pertinent labs & imaging results that were available during my care of the patient were reviewed by me and considered in my medical decision making (see chart for details).  Clinical Course     60 year old male with left shoulder pain. No acute trauma. Ongoing for months. Exam is consistent with shoulder impingement. Discussed avoiding movements which exacerbate his pain in estimated NSAIDs. Patient's upset about not receiving narcotic pain medication. I discussed with him given the chronicity of his complaint and the nature of his pain, I did not feel that an opiate was appropriate treatment. Return precautions discussed. PCP, sports medicine or orthopedic  follow-up.  Final Clinical Impressions(s) / ED Diagnoses   Final diagnoses:  Acute pain of left shoulder    New Prescriptions Discharge Medication List as of 11/28/2015 10:50 AM       Raeford RazorStephen Amory Simonetti, MD 12/11/15 1121

## 2015-12-12 ENCOUNTER — Encounter (HOSPITAL_COMMUNITY): Payer: Self-pay | Admitting: Emergency Medicine

## 2015-12-12 ENCOUNTER — Emergency Department (HOSPITAL_COMMUNITY)
Admission: EM | Admit: 2015-12-12 | Discharge: 2015-12-12 | Disposition: A | Discharge status: Home / Self Care | Payer: Medicaid - Out of State | Attending: Emergency Medicine | Admitting: Emergency Medicine

## 2015-12-12 ENCOUNTER — Emergency Department (HOSPITAL_COMMUNITY): Payer: Medicaid - Out of State

## 2015-12-12 DIAGNOSIS — Z79899 Other long term (current) drug therapy: Secondary | ICD-10-CM | POA: Diagnosis not present

## 2015-12-12 DIAGNOSIS — Z7982 Long term (current) use of aspirin: Secondary | ICD-10-CM | POA: Diagnosis not present

## 2015-12-12 DIAGNOSIS — S90811A Abrasion, right foot, initial encounter: Secondary | ICD-10-CM

## 2015-12-12 DIAGNOSIS — I10 Essential (primary) hypertension: Secondary | ICD-10-CM | POA: Insufficient documentation

## 2015-12-12 DIAGNOSIS — F17210 Nicotine dependence, cigarettes, uncomplicated: Secondary | ICD-10-CM | POA: Diagnosis not present

## 2015-12-12 DIAGNOSIS — S90411A Abrasion, right great toe, initial encounter: Secondary | ICD-10-CM | POA: Diagnosis not present

## 2015-12-12 DIAGNOSIS — Y999 Unspecified external cause status: Secondary | ICD-10-CM | POA: Insufficient documentation

## 2015-12-12 DIAGNOSIS — I2510 Atherosclerotic heart disease of native coronary artery without angina pectoris: Secondary | ICD-10-CM | POA: Diagnosis not present

## 2015-12-12 DIAGNOSIS — S9031XA Contusion of right foot, initial encounter: Secondary | ICD-10-CM | POA: Diagnosis not present

## 2015-12-12 DIAGNOSIS — S99921A Unspecified injury of right foot, initial encounter: Secondary | ICD-10-CM | POA: Diagnosis present

## 2015-12-12 DIAGNOSIS — Y92009 Unspecified place in unspecified non-institutional (private) residence as the place of occurrence of the external cause: Secondary | ICD-10-CM | POA: Diagnosis not present

## 2015-12-12 DIAGNOSIS — F329 Major depressive disorder, single episode, unspecified: Secondary | ICD-10-CM | POA: Diagnosis not present

## 2015-12-12 DIAGNOSIS — J449 Chronic obstructive pulmonary disease, unspecified: Secondary | ICD-10-CM | POA: Diagnosis not present

## 2015-12-12 DIAGNOSIS — W208XXA Other cause of strike by thrown, projected or falling object, initial encounter: Secondary | ICD-10-CM | POA: Diagnosis not present

## 2015-12-12 DIAGNOSIS — Y9389 Activity, other specified: Secondary | ICD-10-CM | POA: Insufficient documentation

## 2015-12-12 MED ORDER — IBUPROFEN 800 MG PO TABS
800.0000 mg | ORAL_TABLET | Freq: Once | ORAL | Status: AC
  Administered 2015-12-12: 800 mg via ORAL
  Filled 2015-12-12: qty 1

## 2015-12-12 MED ORDER — PROMETHAZINE HCL 12.5 MG PO TABS
12.5000 mg | ORAL_TABLET | Freq: Once | ORAL | Status: AC
  Administered 2015-12-12: 12.5 mg via ORAL
  Filled 2015-12-12: qty 1

## 2015-12-12 MED ORDER — IBUPROFEN 600 MG PO TABS: 600.0000 mg | ORAL_TABLET | Freq: Four times a day (QID) | ORAL | 0 refills | Status: AC | PRN

## 2015-12-12 MED ORDER — ACETAMINOPHEN 325 MG PO TABS
650.0000 mg | ORAL_TABLET | Freq: Once | ORAL | Status: AC
  Administered 2015-12-12: 650 mg via ORAL
  Filled 2015-12-12: qty 2

## 2015-12-12 NOTE — ED Notes (Signed)
Pt returned from X-ray.  

## 2015-12-12 NOTE — ED Triage Notes (Signed)
Patient states a concrete block fell on his right foot this morning while moving his bed.

## 2015-12-12 NOTE — ED Provider Notes (Signed)
[facility]-EMERG[facility]Y DEPT Provider Note   CSN: [id number] Arrival date & time: 12/12/15  [id number]     History   Chief Complaint Chief Complaint  Patient presents with  . Foot Injury    HPI Casey Walker is a 60 y.o. male.  Patient is a 60 year old male who presents to the emergency department with a complaint of injury to the right foot.  Patient has a history of hepatitis C, chronic obstructive pulmonary disease, hypertension, kidney disorder, and vertigo. The patient states that he had a concrete block to fall on his right foot this morning while moving a bed. He states that he has pain when he attempts to move his toes. He has pain when he puts weight on the foot. He denies being on any anticoagulation medications. He denies any previous operations or procedures involving the right foot. No other injury was reported. The pain is aggravated by movement and by standing. Nothing seems to take the pain away.   The history is provided by the patient.  Foot Injury      Past Medical History:  Diagnosis Date  . CAD (coronary artery disease)    [place]  . 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 COPD (chronic obstructive pulmonary disease) (HCC)   . Hepatitis C   . Hypertension   . Kidney disorder    due to hepatitis per patient    Patient Active Problem List   Diagnosis Date Noted  . Vertigo 12/01/2015  . Cellulitis 11/10/2015  . Hyperglycemia 11/10/2015  . Syncope 02/03/2015  . Major depression 02/03/2015  . 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 Kidney disorder   . HCV (hepatitis C virus) 11/08/2010  . Arteriosclerosis of coronary artery 10/31/2010  . Essential hypertension 10/31/2010  . Seizure (HCC) 10/31/2010  . Current tobacco use 10/31/2010    Past Surgical History:  Procedure Laterality Date  . CORONARY STENT PLACEMENT    . ELBOW SURGERY    . TRANSURETHRAL RESECTION OF PROSTATE         Home Medications    Prior to Admission  medications   Medication Sig Start Date End Date Taking? Authorizing Provider  albuterol (PROAIR HFA) 108 (90 Base) MCG/ACT inhaler Inhale 2 puffs into the lungs every 6 (six) hours as needed for wheezing or shortness of breath. Shortness of breath and wheezing 06/21/12   Historical Provider, MD  amLODipine (NORVASC) 10 MG tablet Take 1 tablet (10 mg total) by mouth daily. 12/03/15   Leroy SeaPrashant K Singh, MD  ANORO ELLIPTA 62.5-25 MCG/INH AEPB Inhale 1 puff into the lungs daily. 12/16/14   Historical Provider, MD  aspirin EC 81 MG tablet Take 81 mg by mouth daily.    Historical Provider, MD  atenolol (TENORMIN) 50 MG tablet Take 50 mg by mouth 2 (two) times daily.  05/01/12   Historical Provider, MD  BRILINTA 60 MG TABS tablet Take 60 mg by mouth 2 (two) times daily. 10/17/15   Historical Provider, MD  clonazePAM (KLONOPIN) 0.5 MG tablet Take 0.5 mg by mouth 3 (three) times daily. 01/09/15   Historical Provider, MD  diclofenac (VOLTAREN) 50 MG EC tablet Take 1 tablet (50 mg total) by mouth 2 (two) times daily. 11/07/15   Elson AreasLeslie K Sofia, PA-C  finasteride (PROSCAR) 5 MG tablet Take 5 mg by mouth daily. 12/26/14   Historical Provider, MD  folic acid (FOLVITE) 1 MG tablet Take 1 mg by mouth daily. 12/26/14   Historical Provider, MD  hydrochlorothiazide (  HYDRODIURIL) 25 MG tablet Take 25 mg by mouth daily. 05/01/12   Historical Provider, MD  LYRICA 100 MG capsule Take 100-300 mg by mouth 2 (two) times daily. 1 capsule in the morning and 3 capsules at bedtime 01/09/15   Historical Provider, MD  meclizine (ANTIVERT) 25 MG tablet Take 1 tablet (25 mg total) by mouth 3 (three) times daily as needed for dizziness. 12/03/15   Leroy SeaPrashant K Singh, MD  mirtazapine (REMERON) 30 MG tablet Take 30 mg by mouth at bedtime. 12/26/14   Historical Provider, MD  nitroGLYCERIN (NITROSTAT) 0.4 MG SL tablet take one tablet by mouth as directed/as needed for chest pain. Take one tablet every 5 minutes. If no relief after 3 doses, call  911 10/13/15   Historical Provider, MD  OLANZapine (ZYPREXA) 10 MG tablet Take 10 mg by mouth at bedtime. 10/02/15   Historical Provider, MD  OLANZapine (ZYPREXA) 2.5 MG tablet Take 2.5 mg by mouth every morning. 10/02/15   Historical Provider, MD  Omeprazole 20 MG TBEC Take 20 mg by mouth daily. 12/23/14   Historical Provider, MD  oxybutynin (DITROPAN XL) 15 MG 24 hr tablet Take 15 mg by mouth at bedtime.    Historical Provider, MD  pravastatin (PRAVACHOL) 40 MG tablet Take 40 mg by mouth at bedtime. 12/26/14   Historical Provider, MD  risperiDONE (RISPERDAL) 2 MG tablet Take 2 mg by mouth at bedtime. 05/01/12   Historical Provider, MD  sertraline (ZOLOFT) 100 MG tablet Take 100 mg by mouth daily. 12/29/14   Historical Provider, MD  SPIRIVA HANDIHALER 18 MCG inhalation capsule Place 1 puff into inhaler and inhale daily. 11/20/14   Historical Provider, MD  tamsulosin (FLOMAX) 0.4 MG CAPS capsule Take 0.4 mg by mouth every evening. 12/19/14   Historical Provider, MD  temazepam (RESTORIL) 15 MG capsule Take 15 mg by mouth at bedtime. 09/28/15   Historical Provider, MD  traZODone (DESYREL) 100 MG tablet Take 50 mg by mouth at bedtime.  01/11/15   Historical Provider, MD  vitamin B-12 (CYANOCOBALAMIN) 100 MCG tablet Take 100 mcg by mouth daily.    Historical Provider, MD  Vitamin D, Ergocalciferol, (DRISDOL) 50000 units CAPS capsule Take 50,000 Units by mouth every 7 (seven) days.    Historical Provider, MD    Family History Family History  Problem Relation Age of Onset  . Hypertension Mother     Social History Social History  Substance Use Topics  . Smoking status: Current Every Day Smoker    Packs/day: 0.25    Types: Cigarettes  . Smokeless tobacco: Former NeurosurgeonUser  . Alcohol use No     Allergies   Penicillins   Review of Systems Review of Systems  Constitutional: Negative for activity change.       All ROS Neg except as noted in HPI  HENT: Negative for nosebleeds.   Eyes: Negative for  photophobia and discharge.  Respiratory: Negative for cough, shortness of breath and wheezing.   Cardiovascular: Negative for chest pain and palpitations.  Gastrointestinal: Negative for abdominal pain and blood in stool.  Genitourinary: Negative for dysuria, frequency and hematuria.  Musculoskeletal: Positive for arthralgias. Negative for back pain and neck pain.  Skin: Negative.   Neurological: Negative for dizziness, seizures and speech difficulty.  Psychiatric/Behavioral: Negative for confusion and hallucinations.       Depression     Physical Exam Updated Vital Signs BP 136/88 (BP Location: Left Arm)   Pulse (!) 57   Temp 97.5 F (36.4 C) (  Oral)   Resp 18   Ht 5\' 8"  (1.727 m)   Wt 79.8 kg   SpO2 100%   BMI 26.76 kg/m   Physical Exam  Constitutional: He is oriented to person, place, and time. He appears well-developed and well-nourished.  Non-toxic appearance.  HENT:  Head: Normocephalic.  Right Ear: Tympanic membrane and external ear normal.  Left Ear: Tympanic membrane and external ear normal.  Eyes: EOM and lids are normal. Pupils are equal, round, and reactive to light.  Neck: Normal range of motion. Neck supple. Carotid bruit is not present.  Cardiovascular: Normal rate, regular rhythm, normal heart sounds, intact distal pulses and normal pulses.   Pulmonary/Chest: Breath sounds normal. No respiratory distress.  Abdominal: Soft. Bowel sounds are normal. There is no tenderness. There is no guarding.  Musculoskeletal:       Right foot: There is decreased range of motion, tenderness and swelling.       Feet:  Abrasion of the dorsum of the right big toe and 1st and 2nd metatarsal.  Lymphadenopathy:       Head (right side): No submandibular adenopathy present.       Head (left side): No submandibular adenopathy present.    He has no cervical adenopathy.  Neurological: He is alert and oriented to person, place, and time. He has normal strength. No cranial nerve  deficit or sensory deficit.  Skin: Skin is warm and dry.  Psychiatric: He has a normal mood and affect. His speech is normal.  Nursing note and vitals reviewed.    ED Treatments / Results  Labs (all labs ordered are listed, but only abnormal results are displayed) Labs Reviewed - No data to display  EKG  EKG Interpretation None       Radiology Dg Foot Complete Right  Result Date: 12/12/2015 CLINICAL DATA:  A cinder block fell on his foot this morning at home. He has abrasions, swelling, bruising and pain across his foot EXAM: RIGHT FOOT COMPLETE - 3+ VIEW COMPARISON:  None. FINDINGS: There is no evidence of fracture or dislocation. Calcaneal spurs. There is no other evidence of arthropathy or other focal bone abnormality. There is dorsal soft tissue swelling in the midfoot. IMPRESSION: 1. Dorsal soft tissue swelling without fracture. 2. Calcaneal spurs. Electronically Signed   By: Corlis Leak M.D.   On: 12/12/2015 11:20    Procedures Procedures (including critical care time)  Medications Ordered in ED Medications  ibuprofen (ADVIL,MOTRIN) tablet 800 mg (not administered)  acetaminophen (TYLENOL) tablet 650 mg (not administered)  promethazine (PHENERGAN) tablet 12.5 mg (not administered)     Initial Impression / Assessment and Plan / ED Course  I have reviewed the triage vital signs and the nursing notes.  Pertinent labs & imaging results that were available during my care of the patient were reviewed by me and considered in my medical decision making (see chart for details).  Clinical Course     *I have reviewed nursing notes, vital signs, and all appropriate lab and imaging results for this patient.**  Final Clinical Impressions(s) / ED Diagnoses  Vital signs within normal limits. X-ray of the right foot reveals some mild soft tissue swelling, no fracture, no dislocation. The examination is negative for neurovascular changes.  The abrasions/10 needed area on the  dorsum of the foot was covered with Telfa, the patient was then placed in a Watson-Jones splint and postoperative shoe. Crutches were provided. The patient will use ibuprofen and Tylenol for pain. I provided an  ice pack. I've asked to elevate his foot above the level of his waist. Patient is to follow with his primary Medicaid access physician if not improving.    Final diagnoses:  None    New Prescriptions New Prescriptions   No medications on file     Ivery Quale, PA-C 12/12/15 1301    Samuel Jester, DO 12/12/15 1631

## 2015-12-12 NOTE — Discharge Instructions (Signed)
Please leave the splint in place for the next 4 or 5 days. Please do not get the splint wet. Use crutches until you can safely apply weight to your right foot. Use 600 mg of ibuprofen with breakfast, lunch, dinner, and at bedtime. See Dr. Merleen MillinerWinfield or member of his team if any changes, problems, or concerns.

## 2015-12-13 ENCOUNTER — Emergency Department (HOSPITAL_COMMUNITY)
Admission: EM | Admit: 2015-12-13 | Discharge: 2015-12-13 | Disposition: A | Discharge status: Home / Self Care | Payer: Medicaid - Out of State | Attending: Emergency Medicine | Admitting: Emergency Medicine

## 2015-12-13 ENCOUNTER — Encounter (HOSPITAL_COMMUNITY): Payer: Self-pay | Admitting: *Deleted

## 2015-12-13 DIAGNOSIS — S9031XA Contusion of right foot, initial encounter: Secondary | ICD-10-CM | POA: Diagnosis not present

## 2015-12-13 DIAGNOSIS — Z79899 Other long term (current) drug therapy: Secondary | ICD-10-CM | POA: Diagnosis not present

## 2015-12-13 DIAGNOSIS — J449 Chronic obstructive pulmonary disease, unspecified: Secondary | ICD-10-CM | POA: Insufficient documentation

## 2015-12-13 DIAGNOSIS — Z7982 Long term (current) use of aspirin: Secondary | ICD-10-CM | POA: Insufficient documentation

## 2015-12-13 DIAGNOSIS — Y999 Unspecified external cause status: Secondary | ICD-10-CM | POA: Diagnosis not present

## 2015-12-13 DIAGNOSIS — I10 Essential (primary) hypertension: Secondary | ICD-10-CM | POA: Insufficient documentation

## 2015-12-13 DIAGNOSIS — Y92009 Unspecified place in unspecified non-institutional (private) residence as the place of occurrence of the external cause: Secondary | ICD-10-CM | POA: Diagnosis not present

## 2015-12-13 DIAGNOSIS — S99921A Unspecified injury of right foot, initial encounter: Secondary | ICD-10-CM | POA: Diagnosis present

## 2015-12-13 DIAGNOSIS — F17210 Nicotine dependence, cigarettes, uncomplicated: Secondary | ICD-10-CM | POA: Insufficient documentation

## 2015-12-13 DIAGNOSIS — W208XXA Other cause of strike by thrown, projected or falling object, initial encounter: Secondary | ICD-10-CM | POA: Insufficient documentation

## 2015-12-13 DIAGNOSIS — Y9389 Activity, other specified: Secondary | ICD-10-CM | POA: Insufficient documentation

## 2015-12-13 DIAGNOSIS — I2510 Atherosclerotic heart disease of native coronary artery without angina pectoris: Secondary | ICD-10-CM | POA: Insufficient documentation

## 2015-12-13 DIAGNOSIS — Z791 Long term (current) use of non-steroidal anti-inflammatories (NSAID): Secondary | ICD-10-CM | POA: Insufficient documentation

## 2015-12-13 MED ORDER — HYDROCODONE-ACETAMINOPHEN 5-325 MG PO TABS: ORAL_TABLET | ORAL | 0 refills | Status: AC

## 2015-12-13 MED ORDER — OXYCODONE-ACETAMINOPHEN 5-325 MG PO TABS
1.0000 | ORAL_TABLET | Freq: Once | ORAL | Status: AC
  Administered 2015-12-13: 1 via ORAL
  Filled 2015-12-13: qty 1

## 2015-12-13 NOTE — Discharge Instructions (Signed)
It's important to elevate and apply ice packs on/off to your foot.  Use your crutches for walking or standing.  Call the orthopedic doctor listed or f/u with your primary doctor for recheck

## 2015-12-13 NOTE — ED Provider Notes (Signed)
[facility]-EMERGENCY DEPT Provider Note   CSN: [id number] Arrival date & time: 12/13/15  1351     History   Chief Complaint Chief Complaint  Patient presents with  . Foot Pain    HPI Casey Walker is a 60 y.o. male.  HPI   Casey Walker is a 60 y.o. male who presents to the Emergency Department complaining of Persistent pain and swelling of his right foot. He was seen here in this department yesterday for same after dropping a concrete block onto his foot while attempting to move his bed. He states his pain became worse last evening after wearing the postop shoe. He describes a throbbing pain into his foot and toes. Pain is worse with weightbearing or when he sits in a chair. He states he was prescribed ibuprofen which he has been taking for his pain, but states it is not helping. He is no longer wearing his splint or using crutches for ambulation. He denies pain or swelling to his lower leg, knee, fever, chills, numbness or weakness of the foot.   Past Medical History:  Diagnosis Date  . CAD (coronary artery disease)    [place], [place]  . 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 COPD (chronic obstructive pulmonary disease) (HCC)   . Hepatitis C   . Hypertension   . Kidney disorder    due to hepatitis per patient    Patient Active Problem List   Diagnosis Date Noted  . Vertigo 12/01/2015  . Cellulitis 11/10/2015  . Hyperglycemia 11/10/2015  . Syncope 02/03/2015  . Major depression 02/03/2015  . 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 Kidney disorder   . HCV (hepatitis C virus) 11/08/2010  . Arteriosclerosis of coronary artery 10/31/2010  . Essential hypertension 10/31/2010  . Seizure (HCC) 10/31/2010  . Current tobacco use 10/31/2010    Past Surgical History:  Procedure Laterality Date  . CORONARY STENT PLACEMENT    . ELBOW SURGERY    . TRANSURETHRAL RESECTION OF PROSTATE         Home Medications    Prior to Admission medications     Medication Sig Start Date End Date Taking? Authorizing Provider  albuterol (PROAIR HFA) 108 (90 Base) MCG/ACT inhaler Inhale 2 puffs into the lungs every 6 (six) hours as needed for wheezing or shortness of breath. Shortness of breath and wheezing 06/21/12   Historical Provider, MD  amLODipine (NORVASC) 10 MG tablet Take 1 tablet (10 mg total) by mouth daily. 12/03/15   Leroy Sea, MD  ANORO ELLIPTA 62.5-25 MCG/INH AEPB Inhale 1 puff into the lungs daily. 12/16/14   Historical Provider, MD  aspirin EC 81 MG tablet Take 81 mg by mouth daily.    Historical Provider, MD  atenolol (TENORMIN) 50 MG tablet Take 50 mg by mouth 2 (two) times daily.  05/01/12   Historical Provider, MD  BRILINTA 60 MG TABS tablet Take 60 mg by mouth 2 (two) times daily. 10/17/15   Historical Provider, MD  clonazePAM (KLONOPIN) 0.5 MG tablet Take 0.5 mg by mouth 3 (three) times daily. 01/09/15   Historical Provider, MD  diclofenac (VOLTAREN) 50 MG EC tablet Take 1 tablet (50 mg total) by mouth 2 (two) times daily. 11/07/15   Elson Areas, PA-C  finasteride (PROSCAR) 5 MG tablet Take 5 mg by mouth daily. 12/26/14   Historical Provider, MD  folic acid (FOLVITE) 1 MG tablet Take 1 mg by mouth daily. 12/26/14   Historical Provider, MD  hydrochlorothiazide (HYDRODIURIL) 25 MG tablet Take 25 mg by mouth daily. 05/01/12   Historical Provider, MD  ibuprofen (ADVIL,MOTRIN) 600 MG tablet Take 1 tablet (600 mg total) by mouth every 6 (six) hours as needed. 12/12/15   Ivery QualeHobson Bryant, PA-C  LYRICA 100 MG capsule Take 100-300 mg by mouth 2 (two) times daily. 1 capsule in the morning and 3 capsules at bedtime 01/09/15   Historical Provider, MD  meclizine (ANTIVERT) 25 MG tablet Take 1 tablet (25 mg total) by mouth 3 (three) times daily as needed for dizziness. 12/03/15   Leroy SeaPrashant K Singh, MD  mirtazapine (REMERON) 30 MG tablet Take 30 mg by mouth at bedtime. 12/26/14   Historical Provider, MD  nitroGLYCERIN (NITROSTAT) 0.4 MG SL tablet  take one tablet by mouth as directed/as needed for chest pain. Take one tablet every 5 minutes. If no relief after 3 doses, call 911 10/13/15   Historical Provider, MD  OLANZapine (ZYPREXA) 10 MG tablet Take 10 mg by mouth at bedtime. 10/02/15   Historical Provider, MD  OLANZapine (ZYPREXA) 2.5 MG tablet Take 2.5 mg by mouth every morning. 10/02/15   Historical Provider, MD  Omeprazole 20 MG TBEC Take 20 mg by mouth daily. 12/23/14   Historical Provider, MD  oxybutynin (DITROPAN XL) 15 MG 24 hr tablet Take 15 mg by mouth at bedtime.    Historical Provider, MD  pravastatin (PRAVACHOL) 40 MG tablet Take 40 mg by mouth at bedtime. 12/26/14   Historical Provider, MD  risperiDONE (RISPERDAL) 2 MG tablet Take 2 mg by mouth at bedtime. 05/01/12   Historical Provider, MD  sertraline (ZOLOFT) 100 MG tablet Take 100 mg by mouth daily. 12/29/14   Historical Provider, MD  SPIRIVA HANDIHALER 18 MCG inhalation capsule Place 1 puff into inhaler and inhale daily. 11/20/14   Historical Provider, MD  tamsulosin (FLOMAX) 0.4 MG CAPS capsule Take 0.4 mg by mouth every evening. 12/19/14   Historical Provider, MD  temazepam (RESTORIL) 15 MG capsule Take 15 mg by mouth at bedtime. 09/28/15   Historical Provider, MD  traZODone (DESYREL) 100 MG tablet Take 50 mg by mouth at bedtime.  01/11/15   Historical Provider, MD  vitamin B-12 (CYANOCOBALAMIN) 100 MCG tablet Take 100 mcg by mouth daily.    Historical Provider, MD  Vitamin D, Ergocalciferol, (DRISDOL) 50000 units CAPS capsule Take 50,000 Units by mouth every 7 (seven) days.    Historical Provider, MD    Family History Family History  Problem Relation Age of Onset  . Hypertension Mother     Social History Social History  Substance Use Topics  . Smoking status: Current Every Day Smoker    Packs/day: 0.25    Types: Cigarettes  . Smokeless tobacco: Former NeurosurgeonUser  . Alcohol use No     Allergies   Penicillins   Review of Systems Review of Systems  Constitutional:  Negative for chills and fever.  Genitourinary: Negative for difficulty urinating and dysuria.  Musculoskeletal: Positive for arthralgias (Right foot pain and swelling), gait problem and joint swelling.  Skin: Negative for color change and wound.  Neurological: Negative for weakness and numbness.  All other systems reviewed and are negative.    Physical Exam Updated Vital Signs BP 123/70 (BP Location: Left Arm)   Pulse 83   Temp 97.5 F (36.4 C) (Oral)   Resp 18   Ht 5\' 8"  (1.727 m)   Wt 79.8 kg   SpO2 95%   BMI 26.76 kg/m   Physical Exam  Constitutional: He is oriented to person, place, and time. He appears well-developed and well-nourished. No distress.  HENT:  Head: Normocephalic and atraumatic.  Cardiovascular: Normal rate and intact distal pulses.   Dorsalis pedis and posterior tibial pulses are brisk  Pulmonary/Chest: Effort normal and breath sounds normal.  Musculoskeletal: He exhibits tenderness.  Diffuse tenderness of the dorsal right foot, mild to moderate edema and ecchymosis present. Small, superficial abrasions noted to the right dorsal great toe, second toe, fourth and fifth toes.  Sensation intact, cap refill less than 2 seconds.No proximal tenderness. Compartments are soft.   Neurological: He is alert and oriented to person, place, and time. He exhibits normal muscle tone. Coordination normal.  Skin: Skin is warm and dry.  Nursing note and vitals reviewed.    ED Treatments / Results  Labs (all labs ordered are listed, but only abnormal results are displayed) Labs Reviewed - No data to display  EKG  EKG Interpretation None       Radiology Dg Foot Complete Right  Result Date: 12/12/2015 CLINICAL DATA:  A cinder block fell on his foot this morning at home. He has abrasions, swelling, bruising and pain across his foot EXAM: RIGHT FOOT COMPLETE - 3+ VIEW COMPARISON:  None. FINDINGS: There is no evidence of fracture or dislocation. Calcaneal spurs. There  is no other evidence of arthropathy or other focal bone abnormality. There is dorsal soft tissue swelling in the midfoot. IMPRESSION: 1. Dorsal soft tissue swelling without fracture. 2. Calcaneal spurs. Electronically Signed   By: Corlis Leak  Hassell M.D.   On: 12/12/2015 11:20    Procedures Procedures (including critical care time)  Medications Ordered in ED Medications  oxyCODONE-acetaminophen (PERCOCET/ROXICET) 5-325 MG per tablet 1 tablet (not administered)     Initial Impression / Assessment and Plan / ED Course  I have reviewed the triage vital signs and the nursing notes.  Pertinent labs & imaging results that were available during my care of the patient were reviewed by me and considered in my medical decision making (see chart for details).  Clinical Course    Patient seen here yesterday for same. Return today for worsening pain and swelling. He admits to not using crutches or wearing his postop shoe as directed. X-rays from yesterday reviewed by me, no acute fracture seen.  Remains NV intact.  Sx's likely result of contusion.  doubt cellulitis.  Compartments are soft.  Patient was counseled on RICE therapy, and importance of ice and elevation. He remains neurovascularly intact. I will have patient continue ibuprofen as directed, prescription for hydrocodone also added. Patient agrees to orthopedic follow-up.   Final Clinical Impressions(s) / ED Diagnoses   Final diagnoses:  Contusion of right foot, initial encounter    New Prescriptions New Prescriptions   No medications on file     Pauline Ausammy Ralphael [place], Cordelia Poche-C 12/13/15 1543    Donnetta HutchingBrian Cook, MD 12/14/15 1715

## 2015-12-13 NOTE — ED Triage Notes (Addendum)
Pt reports he dropped a cinder block on his right foot yesterday and is having right foot swelling and pain. Pt's right foot is swollen, redness and bruising noted, abrasions to all 5 digits. Pt not ambulatory in triage. Pt reports he was seen here at [facility] and was told to follow up here again if his foot got worse. Pt reports the pain and swelling is worse today than yesterday. Pt was given a splint yesterday and pt reports he took it off because his foot started swelling.

## 2017-12-03 IMAGING — DX DG RIBS W/ CHEST 3+V*R*
4 series · 4 of 4 positions shown · non-contrast
Comparison: None.

CLINICAL DATA: Patient status post fall down multiple stairs. Right
back and left shoulder pain. Initial encounter.

EXAM:
RIGHT RIBS AND CHEST - 3+ VIEW

[chest pa]
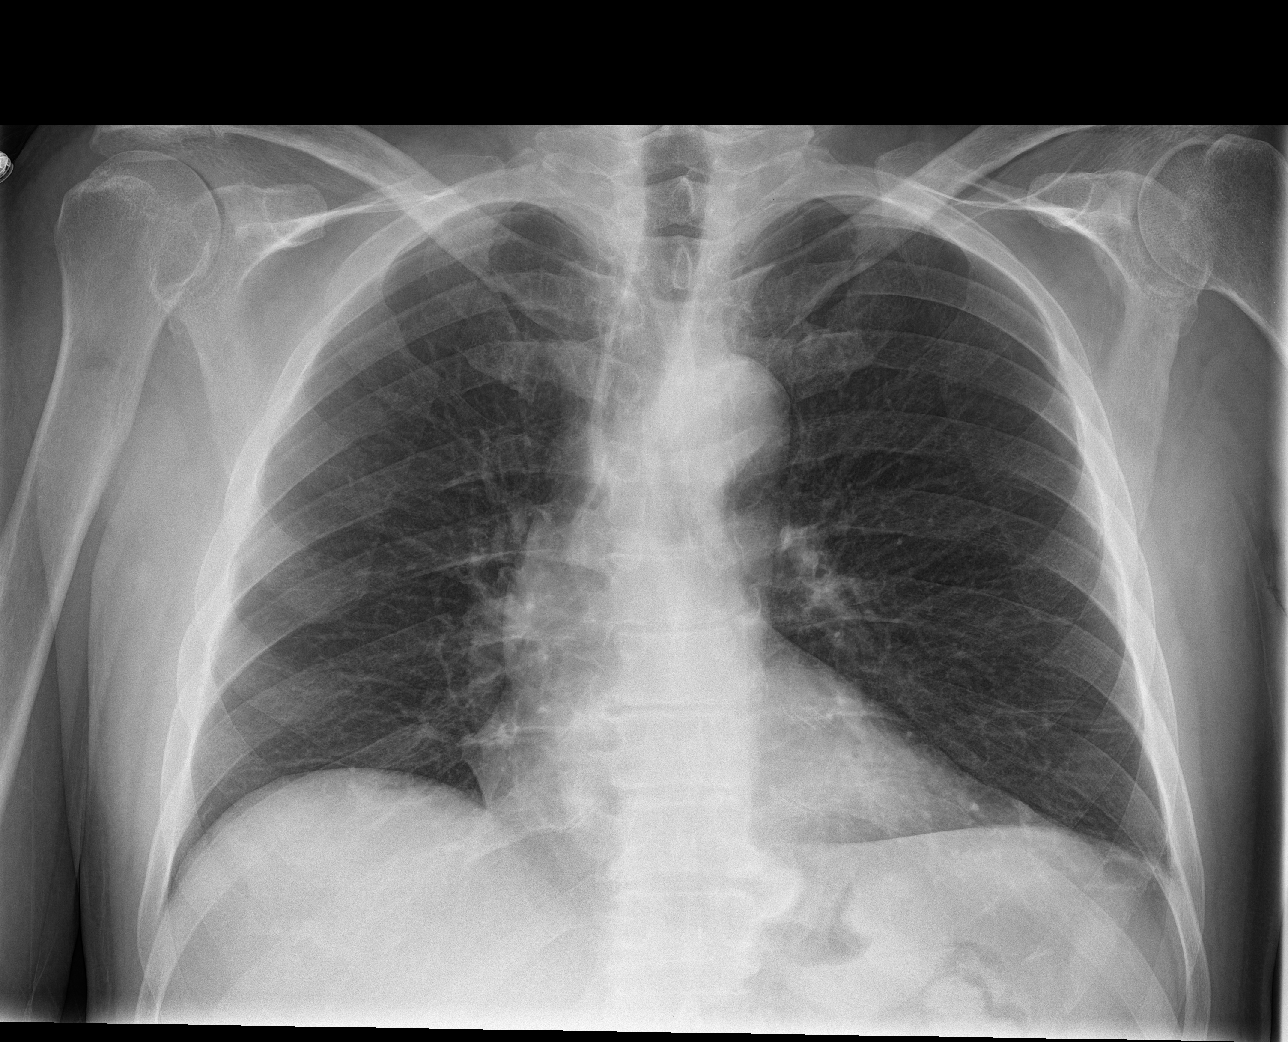

[rib pa]
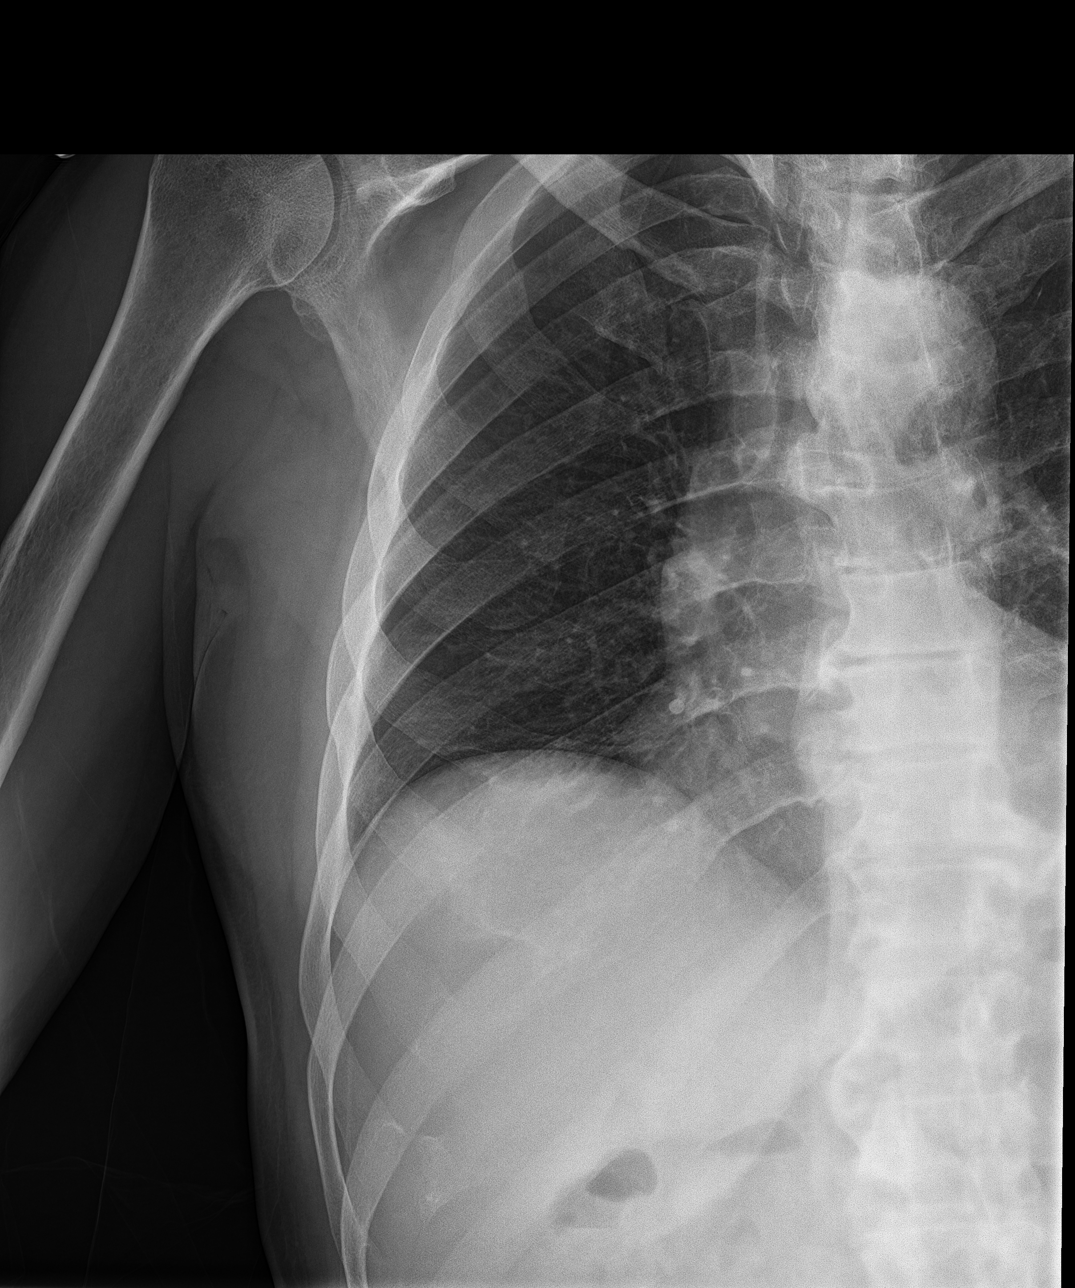

[rib obl (1 of 2)]
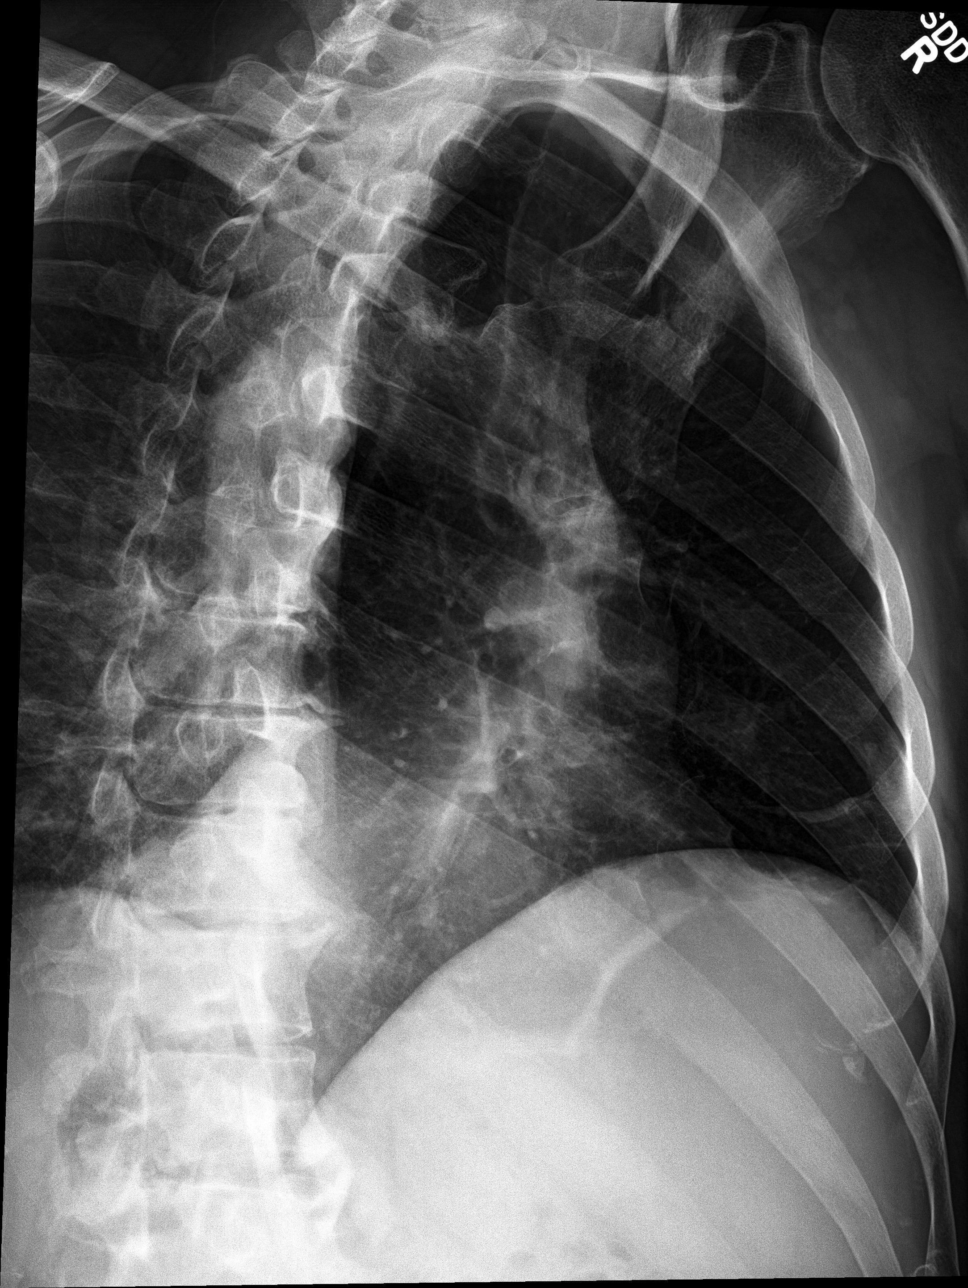

[rib obl (2 of 2)]
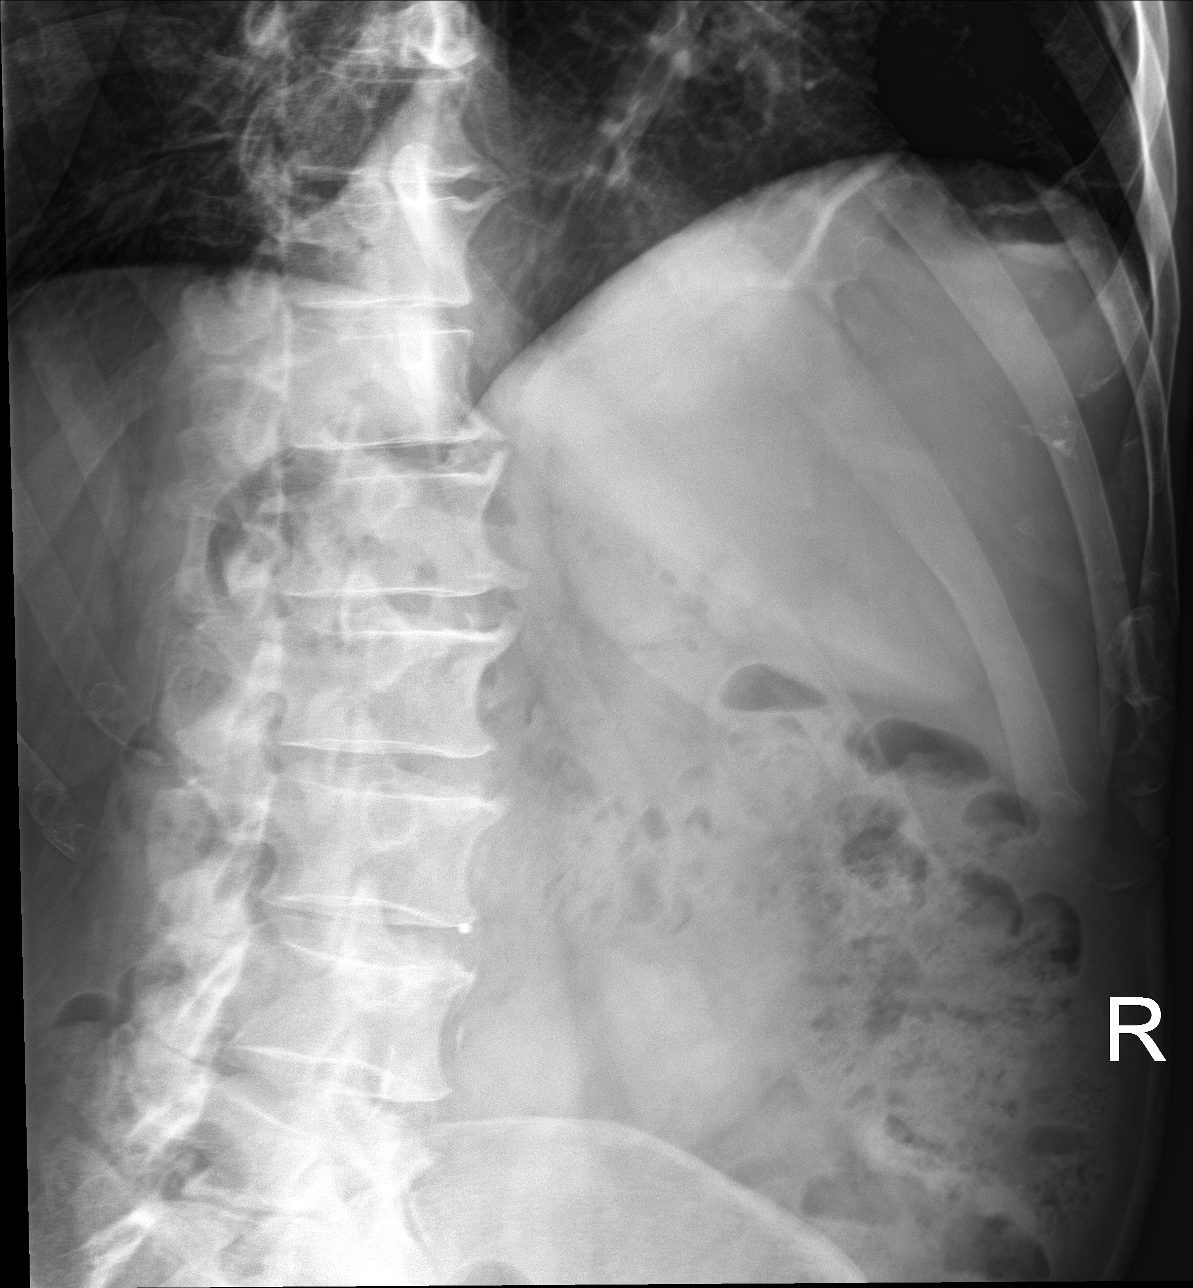

[4 of 4 positions shown; findings below may reference images not displayed]

FINDINGS: Normal cardiac and mediastinal contours. Tortuosity of the thoracic
aorta. No consolidative pulmonary opacities. No pleural effusion or
pneumothorax. Bilateral AC joint degenerative changes. Thoracic
spine degenerative changes. There is suggestion of a nondisplaced
fracture through the posterior aspect of the right eleventh rib.
IMPRESSION: Suggestion of nondisplaced fracture through the posterior right
eleventh rib.

No acute cardiopulmonary process.

## 2018-04-08 ENCOUNTER — Emergency Department (HOSPITAL_COMMUNITY)
Admission: EM | Admit: 2018-04-08 | Discharge: 2018-04-08 | Disposition: A | Discharge status: Home / Self Care | Payer: Medicaid - Out of State | Attending: Emergency Medicine | Admitting: Emergency Medicine

## 2018-04-08 ENCOUNTER — Emergency Department (HOSPITAL_COMMUNITY): Payer: Medicaid - Out of State

## 2018-04-08 ENCOUNTER — Encounter (HOSPITAL_COMMUNITY): Payer: Self-pay | Admitting: Emergency Medicine

## 2018-04-08 ENCOUNTER — Other Ambulatory Visit: Payer: Self-pay

## 2018-04-08 DIAGNOSIS — J111 Influenza due to unidentified influenza virus with other respiratory manifestations: Secondary | ICD-10-CM | POA: Insufficient documentation

## 2018-04-08 DIAGNOSIS — Z79899 Other long term (current) drug therapy: Secondary | ICD-10-CM | POA: Insufficient documentation

## 2018-04-08 DIAGNOSIS — J449 Chronic obstructive pulmonary disease, unspecified: Secondary | ICD-10-CM | POA: Diagnosis not present

## 2018-04-08 DIAGNOSIS — R05 Cough: Secondary | ICD-10-CM | POA: Diagnosis present

## 2018-04-08 DIAGNOSIS — Z7982 Long term (current) use of aspirin: Secondary | ICD-10-CM | POA: Diagnosis not present

## 2018-04-08 DIAGNOSIS — F17210 Nicotine dependence, cigarettes, uncomplicated: Secondary | ICD-10-CM | POA: Diagnosis not present

## 2018-04-08 DIAGNOSIS — I2510 Atherosclerotic heart disease of native coronary artery without angina pectoris: Secondary | ICD-10-CM | POA: Insufficient documentation

## 2018-04-08 DIAGNOSIS — I10 Essential (primary) hypertension: Secondary | ICD-10-CM | POA: Diagnosis not present

## 2018-04-08 DIAGNOSIS — R69 Illness, unspecified: Secondary | ICD-10-CM

## 2018-04-08 LAB — COMPREHENSIVE METABOLIC PANEL
ALBUMIN: 3.8 g/dL (ref 3.5–5.0)
ALT: 50 U/L — AB (ref 0–44)
AST: 43 U/L — AB (ref 15–41)
Alkaline Phosphatase: 74 U/L (ref 38–126)
Anion gap: 6 (ref 5–15)
BUN: 12 mg/dL (ref 8–23)
CALCIUM: 9.2 mg/dL (ref 8.9–10.3)
CHLORIDE: 108 mmol/L (ref 98–111)
CO2: 24 mmol/L (ref 22–32)
CREATININE: 0.8 mg/dL (ref 0.61–1.24)
GFR calc Af Amer: >60 mL/min (ref >60)
Glucose, Bld: 90 mg/dL (ref 70–99)
Potassium: 3.8 mmol/L (ref 3.5–5.1)
SODIUM: 138 mmol/L (ref 135–145)
Total Bilirubin: 0.3 mg/dL (ref 0.3–1.2)
Total Protein: 7.9 g/dL (ref 6.5–8.1)

## 2018-04-08 LAB — CBC WITH DIFFERENTIAL/PLATELET
ABS IMMATURE GRANULOCYTES: 0 10*3/uL (ref 0.00–0.07)
Basophils Absolute: 0 10*3/uL (ref 0.0–0.1)
Basophils Relative: 1 %
Eosinophils Absolute: 0.1 10*3/uL (ref 0.0–0.5)
Eosinophils Relative: 4 %
HEMATOCRIT: 41.1 % (ref 39.0–52.0)
Hemoglobin: 13.2 g/dL (ref 13.0–17.0)
IMMATURE GRANULOCYTES: 0 %
LYMPHS ABS: 1.7 10*3/uL (ref 0.7–4.0)
LYMPHS PCT: 50 %
MCH: 30.6 pg (ref 26.0–34.0)
MCHC: 32.1 g/dL (ref 30.0–36.0)
MCV: 95.1 fL (ref 80.0–100.0)
Monocytes Absolute: 0.3 10*3/uL (ref 0.1–1.0)
Monocytes Relative: 9 %
NEUTROS ABS: 1.2 10*3/uL — AB (ref 1.7–7.7)
NEUTROS PCT: 36 %
Platelets: 129 10*3/uL — ABNORMAL LOW (ref 150–400)
RBC: 4.32 MIL/uL (ref 4.22–5.81)
RDW: 15.4 % (ref 11.5–15.5)
WBC: 3.3 10*3/uL — ABNORMAL LOW (ref 4.0–10.5)
nRBC: 0 % (ref 0.0–0.2)

## 2018-04-08 LAB — LIPASE, BLOOD: LIPASE: 37 U/L (ref 11–51)

## 2018-04-08 LAB — ETHANOL

## 2018-04-08 MED ORDER — TRAMADOL HCL 50 MG PO TABS
50.0000 mg | ORAL_TABLET | Freq: Once | ORAL | Status: AC
  Administered 2018-04-08: 50 mg via ORAL
  Filled 2018-04-08: qty 1

## 2018-04-08 MED ORDER — SODIUM CHLORIDE 0.9 % IV BOLUS
500.0000 mL | Freq: Once | INTRAVENOUS | Status: AC
  Administered 2018-04-08: 500 mL via INTRAVENOUS

## 2018-04-08 MED ORDER — PREDNISONE 50 MG PO TABS
60.0000 mg | ORAL_TABLET | ORAL | Status: AC
  Administered 2018-04-08: 60 mg via ORAL
  Filled 2018-04-08: qty 1

## 2018-04-08 MED ORDER — PREDNISONE 20 MG PO TABS: 40.0000 mg | ORAL_TABLET | Freq: Every day | ORAL | 0 refills | Status: AC

## 2018-04-08 MED ORDER — ONDANSETRON HCL 4 MG/2ML IJ SOLN
4.0000 mg | Freq: Once | INTRAMUSCULAR | Status: AC
  Administered 2018-04-08: 4 mg via INTRAVENOUS
  Filled 2018-04-08: qty 2

## 2018-04-08 NOTE — ED Notes (Signed)
Attempting to get in touch with pt's sister to transport pt back home. No answer when called.

## 2018-04-08 NOTE — ED Triage Notes (Signed)
Patient complains of generalized body aches, fever, cough x 5 days.

## 2018-04-08 NOTE — ED Provider Notes (Signed)
[facility] EMERGENCY DEPARTMENT Provider Note   CSN: [id number] Arrival date & time: 04/08/18  1544    History   Chief Complaint Chief Complaint  Patient presents with  . Generalized Body Aches  . Fever    HPI Casey Walker is a 63 y.o. male.     HPI Patient with multiple medical issues including COPD, ongoing cigarette addiction presents with 4 or 5 days of generalized discomfort, nausea, cough. No clear precipitant. Since onset symptoms have been persistent. Patient is taking no new medication for pain relief. He denies focal pain including chest or abdominal discomfort. However, the patient states that he feels poorly in general, has worsening appetite, nausea, has had diminished oral intake throughout this illness. Patient does not have a thermometer, cannot comment on specific fever.  He states that he did receive both influenza and pneumonia shot this year.  Past Medical History:  Diagnosis Date  . CAD (coronary artery disease)    [place], [place]  . 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 COPD (chronic obstructive pulmonary disease) (HCC)   . Hepatitis C   . Hypertension   . Kidney disorder    due to hepatitis per patient    Patient Active Problem List   Diagnosis Date Noted  . Vertigo 12/01/2015  . Cellulitis 11/10/2015  . Hyperglycemia 11/10/2015  . Syncope 02/03/2015  . Major depression 02/03/2015  . 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 Kidney disorder   . HCV (hepatitis C virus) 11/08/2010  . Arteriosclerosis of coronary artery 10/31/2010  . Essential hypertension 10/31/2010  . Seizure (HCC) 10/31/2010  . Current tobacco use 10/31/2010    Past Surgical History:  Procedure Laterality Date  . CORONARY STENT PLACEMENT    . ELBOW SURGERY    . TRANSURETHRAL RESECTION OF PROSTATE          Home Medications    Prior to Admission medications   Medication Sig Start Date End Date Taking? Authorizing Provider  albuterol  (PROAIR HFA) 108 (90 Base) MCG/ACT inhaler Inhale 2 puffs into the lungs every 6 (six) hours as needed for wheezing or shortness of breath. Shortness of breath and wheezing 06/21/12   [provider]  amLODipine (NORVASC) 10 MG tablet Take 1 tablet (10 mg total) by mouth daily. 12/03/15   Leroy Sea, MD  ANORO ELLIPTA 62.5-25 MCG/INH AEPB Inhale 1 puff into the lungs daily. 12/16/14   [provider]  aspirin EC 81 MG tablet Take 81 mg by mouth daily.    [provider]  atenolol (TENORMIN) 50 MG tablet Take 50 mg by mouth 2 (two) times daily.  05/01/12   [provider]  BRILINTA 60 MG TABS tablet Take 60 mg by mouth 2 (two) times daily. 10/17/15   [provider]  clonazePAM (KLONOPIN) 0.5 MG tablet Take 0.5 mg by mouth 3 (three) times daily. 01/09/15   [provider]  diclofenac (VOLTAREN) 50 MG EC tablet Take 1 tablet (50 mg total) by mouth 2 (two) times daily. 11/07/15   Elson Areas, PA-C  finasteride (PROSCAR) 5 MG tablet Take 5 mg by mouth daily. 12/26/14   [provider]  folic acid (FOLVITE) 1 MG tablet Take 1 mg by mouth daily. 12/26/14   [provider]  hydrochlorothiazide (HYDRODIURIL) 25 MG tablet Take 25 mg by mouth daily. 05/01/12   [provider]  HYDROcodone-acetaminophen (NORCO/VICODIN) 5-325 MG tablet Take one tab po q 4-6 hrs prn pain  12/13/15   Triplett, Tammy, PA-C  ibuprofen (ADVIL,MOTRIN) 600 MG tablet Take 1 tablet (600 mg total) by mouth every 6 (six) hours as needed. 12/12/15   Ivery Quale, PA-C  LYRICA 100 MG capsule Take 100-300 mg by mouth 2 (two) times daily. 1 capsule in the morning and 3 capsules at bedtime 01/09/15   [provider]  meclizine (ANTIVERT) 25 MG tablet Take 1 tablet (25 mg total) by mouth 3 (three) times daily as needed for dizziness. 12/03/15   Leroy Sea, MD  mirtazapine (REMERON) 30 MG tablet Take 30 mg by mouth at bedtime. 12/26/14    [provider]  nitroGLYCERIN (NITROSTAT) 0.4 MG SL tablet take one tablet by mouth as directed/as needed for chest pain. Take one tablet every 5 minutes. If no relief after 3 doses, call 911 10/13/15   [provider]  OLANZapine (ZYPREXA) 10 MG tablet Take 10 mg by mouth at bedtime. 10/02/15   [provider]  OLANZapine (ZYPREXA) 2.5 MG tablet Take 2.5 mg by mouth every morning. 10/02/15   [provider]  Omeprazole 20 MG TBEC Take 20 mg by mouth daily. 12/23/14   [provider]  oxybutynin (DITROPAN XL) 15 MG 24 hr tablet Take 15 mg by mouth at bedtime.    [provider]  pravastatin (PRAVACHOL) 40 MG tablet Take 40 mg by mouth at bedtime. 12/26/14   [provider]  risperiDONE (RISPERDAL) 2 MG tablet Take 2 mg by mouth at bedtime. 05/01/12   [provider]  sertraline (ZOLOFT) 100 MG tablet Take 100 mg by mouth daily. 12/29/14   [provider]  SPIRIVA HANDIHALER 18 MCG inhalation capsule Place 1 puff into inhaler and inhale daily. 11/20/14   [provider]  tamsulosin (FLOMAX) 0.4 MG CAPS capsule Take 0.4 mg by mouth every evening. 12/19/14   [provider]  temazepam (RESTORIL) 15 MG capsule Take 15 mg by mouth at bedtime. 09/28/15   [provider]  traZODone (DESYREL) 100 MG tablet Take 50 mg by mouth at bedtime.  01/11/15   [provider]  vitamin B-12 (CYANOCOBALAMIN) 100 MCG tablet Take 100 mcg by mouth daily.    [provider]  Vitamin D, Ergocalciferol, (DRISDOL) 50000 units CAPS capsule Take 50,000 Units by mouth every 7 (seven) days.    [provider]    Family History Family History  Problem Relation Age of Onset  . Hypertension Mother     Social History Social History   Tobacco Use  . Smoking status: Current Every Day Smoker    Packs/day: 0.25    Types: Cigarettes  . Smokeless tobacco: Former Engineer, water Use Topics  . Alcohol  use: No  . Drug use: No     Allergies   Penicillins   Review of Systems Review of Systems  Constitutional:       Per HPI, otherwise negative  HENT:       Per HPI, otherwise negative  Respiratory:       Per HPI, otherwise negative  Cardiovascular:       Per HPI, otherwise negative  Gastrointestinal: Positive for nausea. Negative for vomiting.  Endocrine:       Negative aside from HPI  Genitourinary:       Neg aside from HPI   Musculoskeletal:       Per HPI, otherwise negative  Skin: Negative.   Neurological: Positive for weakness. Negative for syncope.     Physical Exam  Updated Vital Signs BP 123/83 (BP Location: Right Arm)   Pulse 66   Temp (!) 97.2 F (36.2 C) (Oral)   Ht 5\' 8"  (1.727 m)   Wt 71.7 kg   SpO2 100%   BMI 24.02 kg/m   Physical Exam Vitals signs and nursing note reviewed.  Constitutional:      General: He is not in acute distress.    Appearance: He is well-developed.  HENT:     Head: Normocephalic and atraumatic.  Eyes:     Conjunctiva/sclera: Conjunctivae normal.  Cardiovascular:     Rate and Rhythm: Normal rate and regular rhythm.  Pulmonary:     Effort: Pulmonary effort is normal. No respiratory distress.     Breath sounds: No stridor. No wheezing.  Abdominal:     General: There is no distension.     Tenderness: There is no abdominal tenderness. There is no guarding.  Skin:    General: Skin is warm and dry.  Neurological:     Mental Status: He is alert and oriented to person, place, and time.      ED Treatments / Results  Labs (all labs ordered are listed, but only abnormal results are displayed) Labs Reviewed  COMPREHENSIVE METABOLIC PANEL - Abnormal; Notable for the following components:      Result Value   AST 43 (*)    ALT 50 (*)    All other components within normal limits  CBC WITH DIFFERENTIAL/PLATELET - Abnormal; Notable for the following components:   WBC 3.3 (*)    Platelets 129 (*)    Neutro Abs 1.2 (*)    All  other components within normal limits  ETHANOL  LIPASE, BLOOD    EKG None  Radiology Dg Chest 2 View  Result Date: 04/08/2018 CLINICAL DATA:  Generalized body aches, fever and cough. EXAM: CHEST - 2 VIEW COMPARISON:  12/01/2015 FINDINGS: Dual lead cardiac pacemaker in expected radiographic position. Cardiomediastinal silhouette is normal. Mediastinal contours appear intact. There is no evidence of focal airspace consolidation, pleural effusion or pneumothorax. Osseous structures are without acute abnormality. Soft tissues are grossly normal. IMPRESSION: No active cardiopulmonary disease. Electronically Signed   By: Ted Mcalpine M.D.   On: 04/08/2018 16:56    Procedures Procedures (including critical care time)  Medications Ordered in ED Medications  traMADol (ULTRAM) tablet 50 mg (has no administration in time range)  predniSONE (DELTASONE) tablet 60 mg (has no administration in time range)  sodium chloride 0.9 % bolus 500 mL (500 mLs Intravenous New Bag/Given 04/08/18 1710)  ondansetron (ZOFRAN) injection 4 mg (4 mg Intravenous Given 04/08/18 1711)     Initial Impression / Assessment and Plan / ED Course  I have reviewed the triage vital signs and the nursing notes.  Pertinent labs & imaging results that were available during my care of the patient were reviewed by me and considered in my medical decision making (see chart for details).        6:05 PM On repeat exam the patient is standing upright in his room, walking about, in no distress. He has no increased work of breathing, no evidence for decompensation. Labs reviewed, discussed with the patient. Generally reassuring findings, no leukocytosis, and given his absence of fever, reassuring x-ray, with no evidence for pneumonia, there is some suspicion for smoking-related changes, COPD exacerbation. With no distress, no other new complaints, the patient was discharged in stable condition to follow-up with primary care.    Final Clinical Impressions(s) / ED Diagnoses  Influenza-like illness   Gerhard Munch, MD 04/08/18 [phone]

## 2018-04-08 NOTE — Discharge Instructions (Signed)
As discussed, your evaluation today has been largely reassuring.  But, it is important that you monitor your condition carefully, and do not hesitate to return to the ED if you develop new, or concerning changes in your condition. ? ?Otherwise, please follow-up with your physician for appropriate ongoing care. ? ?

## 2018-08-26 IMAGING — DX DG SHOULDER 2+V*L*
2 series · 2 of 2 positions shown · non-contrast
Comparison: 01/22/2015; 09/05/2014

CLINICAL DATA: Left shoulder pain. History of shoulder dislocation
approximately 6 months ago.

EXAM:
LEFT SHOULDER - 2+ VIEW

[shoulder ap]
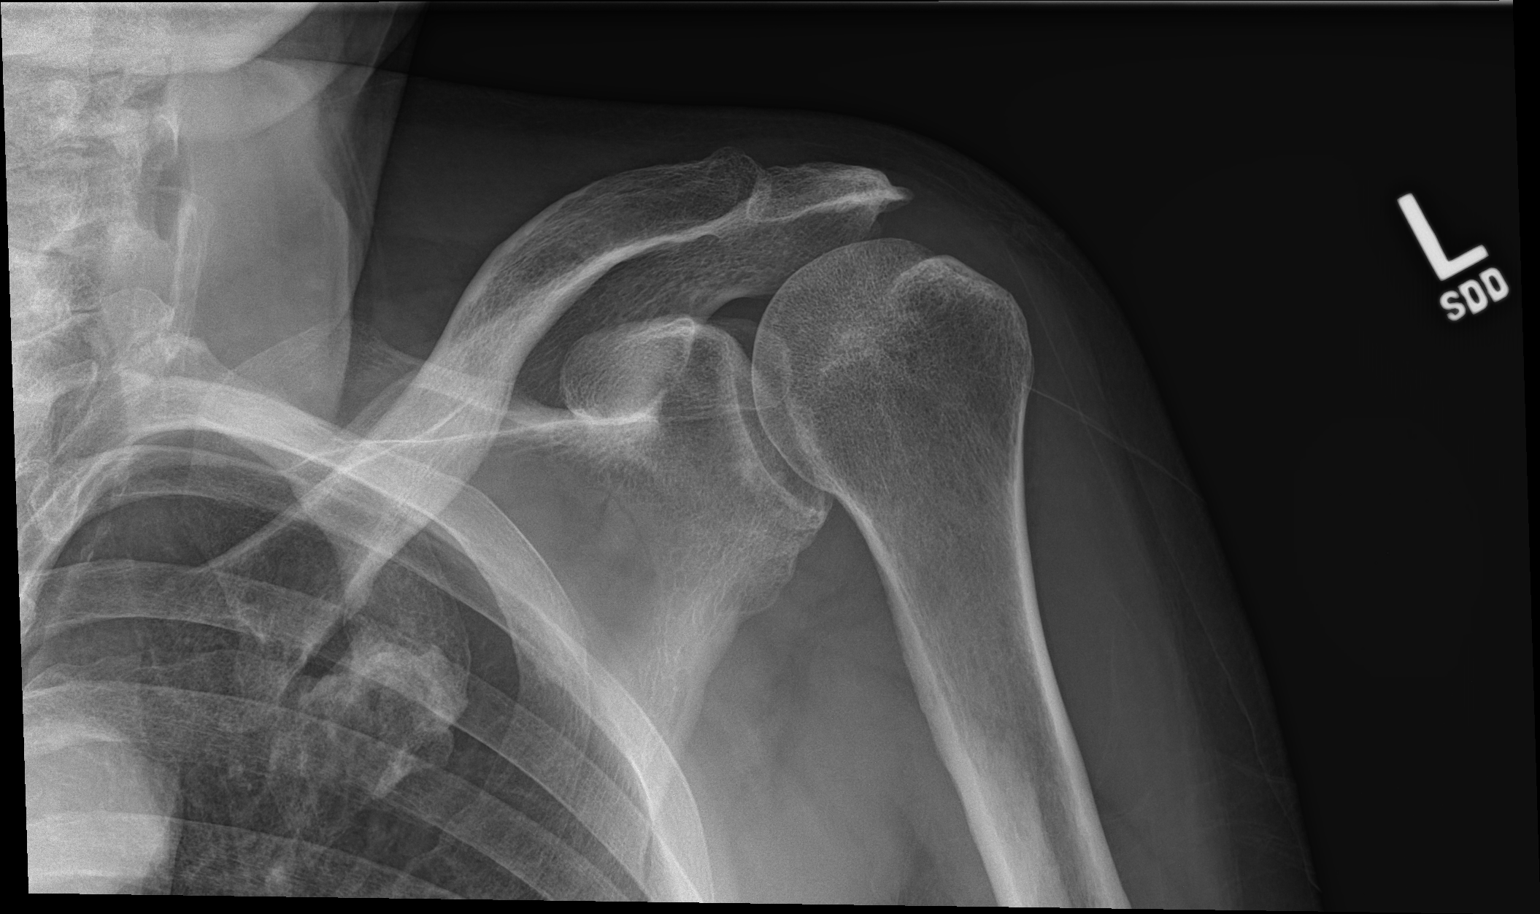

[shoulder y view]
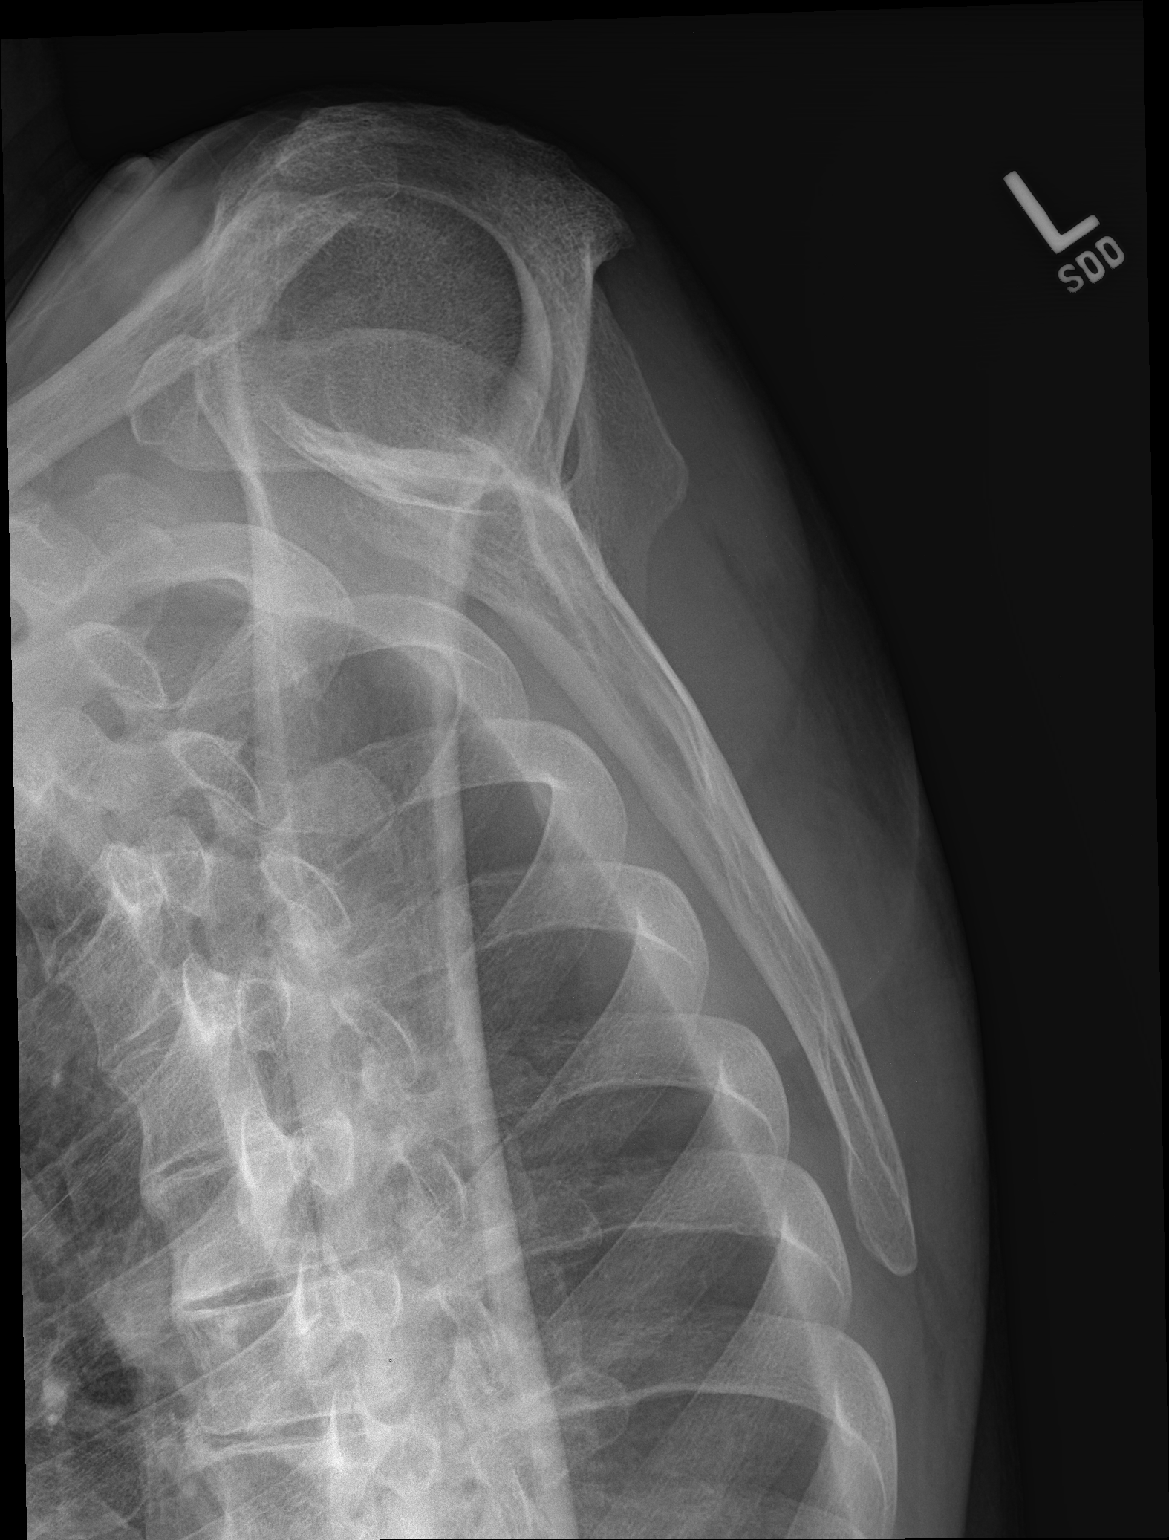

[2 of 2 positions shown; findings below may reference images not displayed]

FINDINGS: No fracture or dislocation. Glenohumeral and acromioclavicular joint
spaces appear preserved given obliquity. No evidence of calcific
tendinitis. Limited visualization of the adjacent thorax
demonstrates increased sclerosis involving the anterior aspect of
the left first rib, unchanged. Regional soft tissues appear normal.
IMPRESSION: Unremarkable left shoulder radiographs.
